# Patient Record
Sex: Female | Born: 1990 | Race: White | Hispanic: No | Marital: Married | Smoking: Never smoker
Health system: Southern US, Community
[De-identification: ages and names within clinical notes are randomized; demographics above are authoritative.]

## PROBLEM LIST (undated history)

## (undated) DIAGNOSIS — E119 Type 2 diabetes mellitus without complications: Secondary | ICD-10-CM

## (undated) DIAGNOSIS — F418 Other specified anxiety disorders: Secondary | ICD-10-CM

## (undated) DIAGNOSIS — G5620 Lesion of ulnar nerve, unspecified upper limb: Secondary | ICD-10-CM

## (undated) DIAGNOSIS — R519 Headache, unspecified: Secondary | ICD-10-CM

## (undated) DIAGNOSIS — E7800 Pure hypercholesterolemia, unspecified: Secondary | ICD-10-CM

## (undated) DIAGNOSIS — G4700 Insomnia, unspecified: Secondary | ICD-10-CM

## (undated) DIAGNOSIS — E782 Mixed hyperlipidemia: Secondary | ICD-10-CM

## (undated) DIAGNOSIS — F32A Depression, unspecified: Secondary | ICD-10-CM

## (undated) DIAGNOSIS — R7303 Prediabetes: Secondary | ICD-10-CM

## (undated) DIAGNOSIS — F419 Anxiety disorder, unspecified: Secondary | ICD-10-CM

## (undated) DIAGNOSIS — R51 Headache: Secondary | ICD-10-CM

## (undated) DIAGNOSIS — F329 Major depressive disorder, single episode, unspecified: Secondary | ICD-10-CM

## (undated) HISTORY — DX: Other specified anxiety disorders: F41.8

## (undated) HISTORY — DX: Type 2 diabetes mellitus without complications: E11.9

## (undated) HISTORY — DX: Mixed hyperlipidemia: E78.2

---
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
## 2012-08-12 IMAGING — CR DG THORACIC SPINE 2V
3 series · 3 of 3 positions shown · non-contrast
Comparison: None

CLINICAL DATA: Injured playing [employer].

THORACIC SPINE - 2 VIEW

[t t-spine a.p. *]
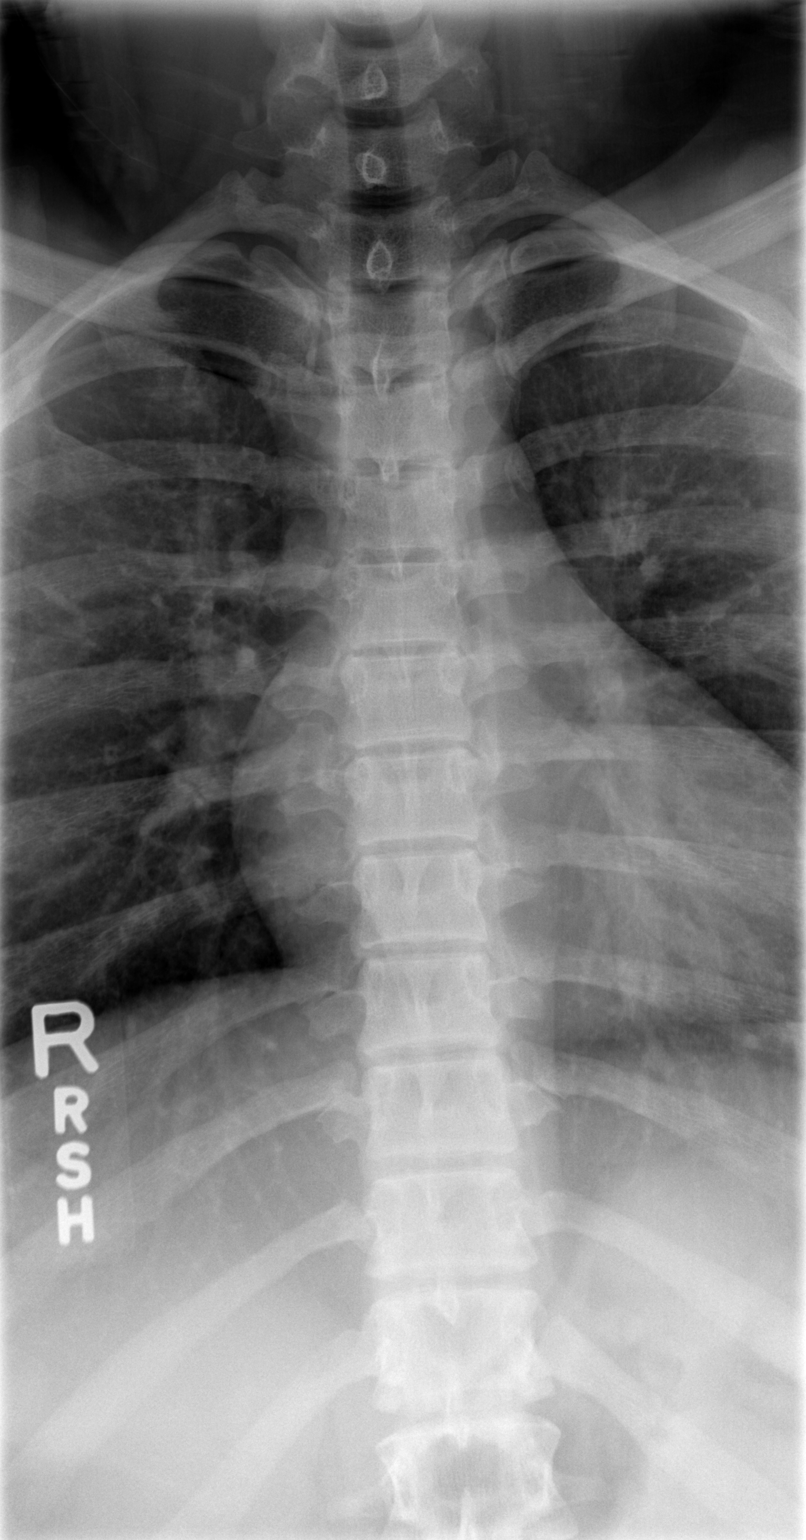

[t t-spine lat]
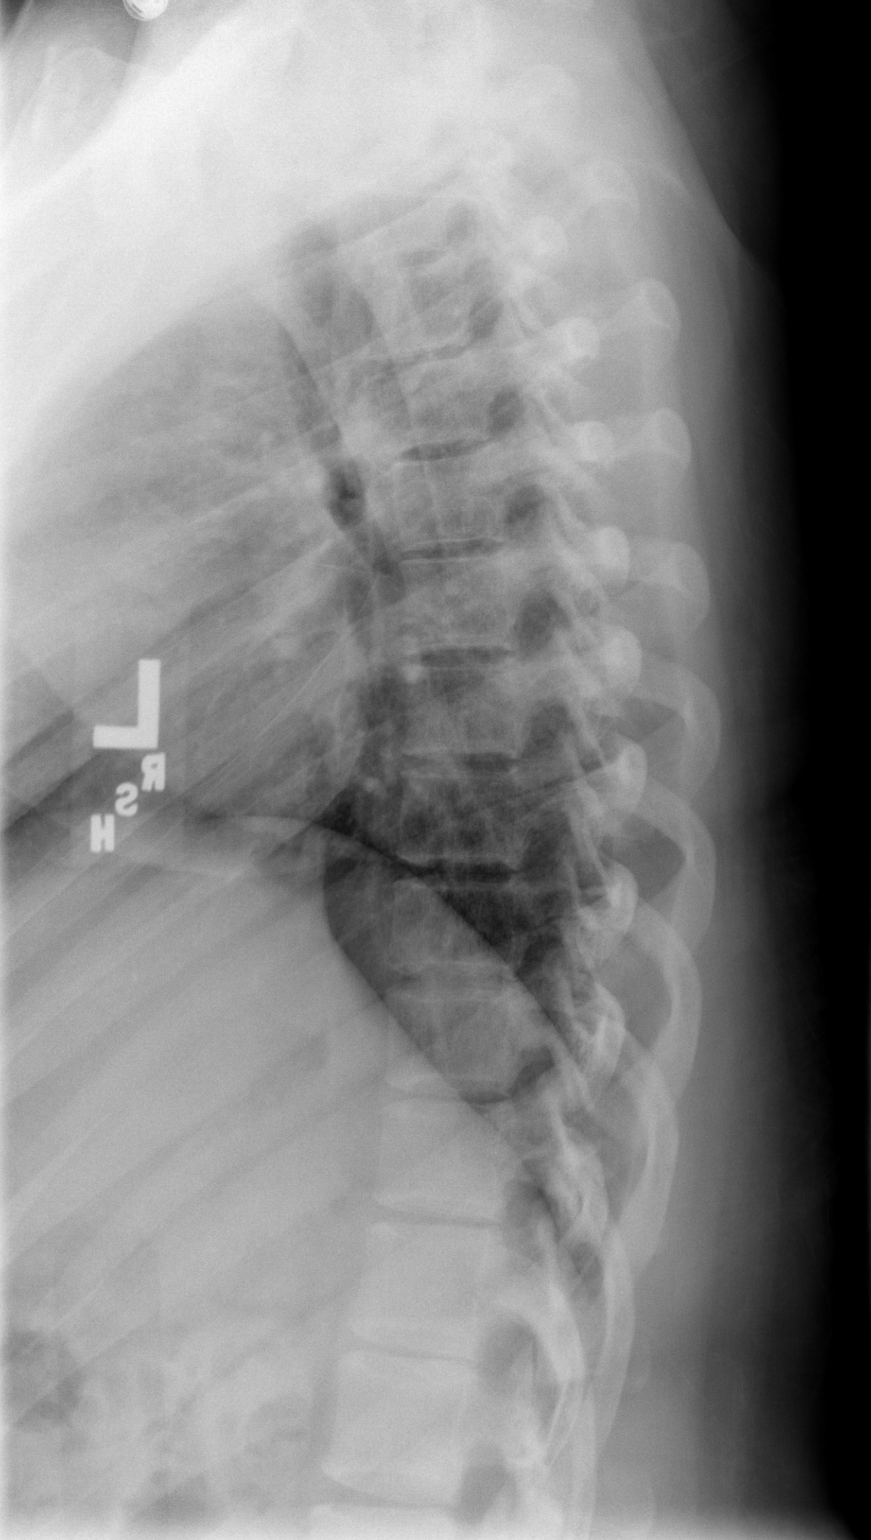

[t swimmers]
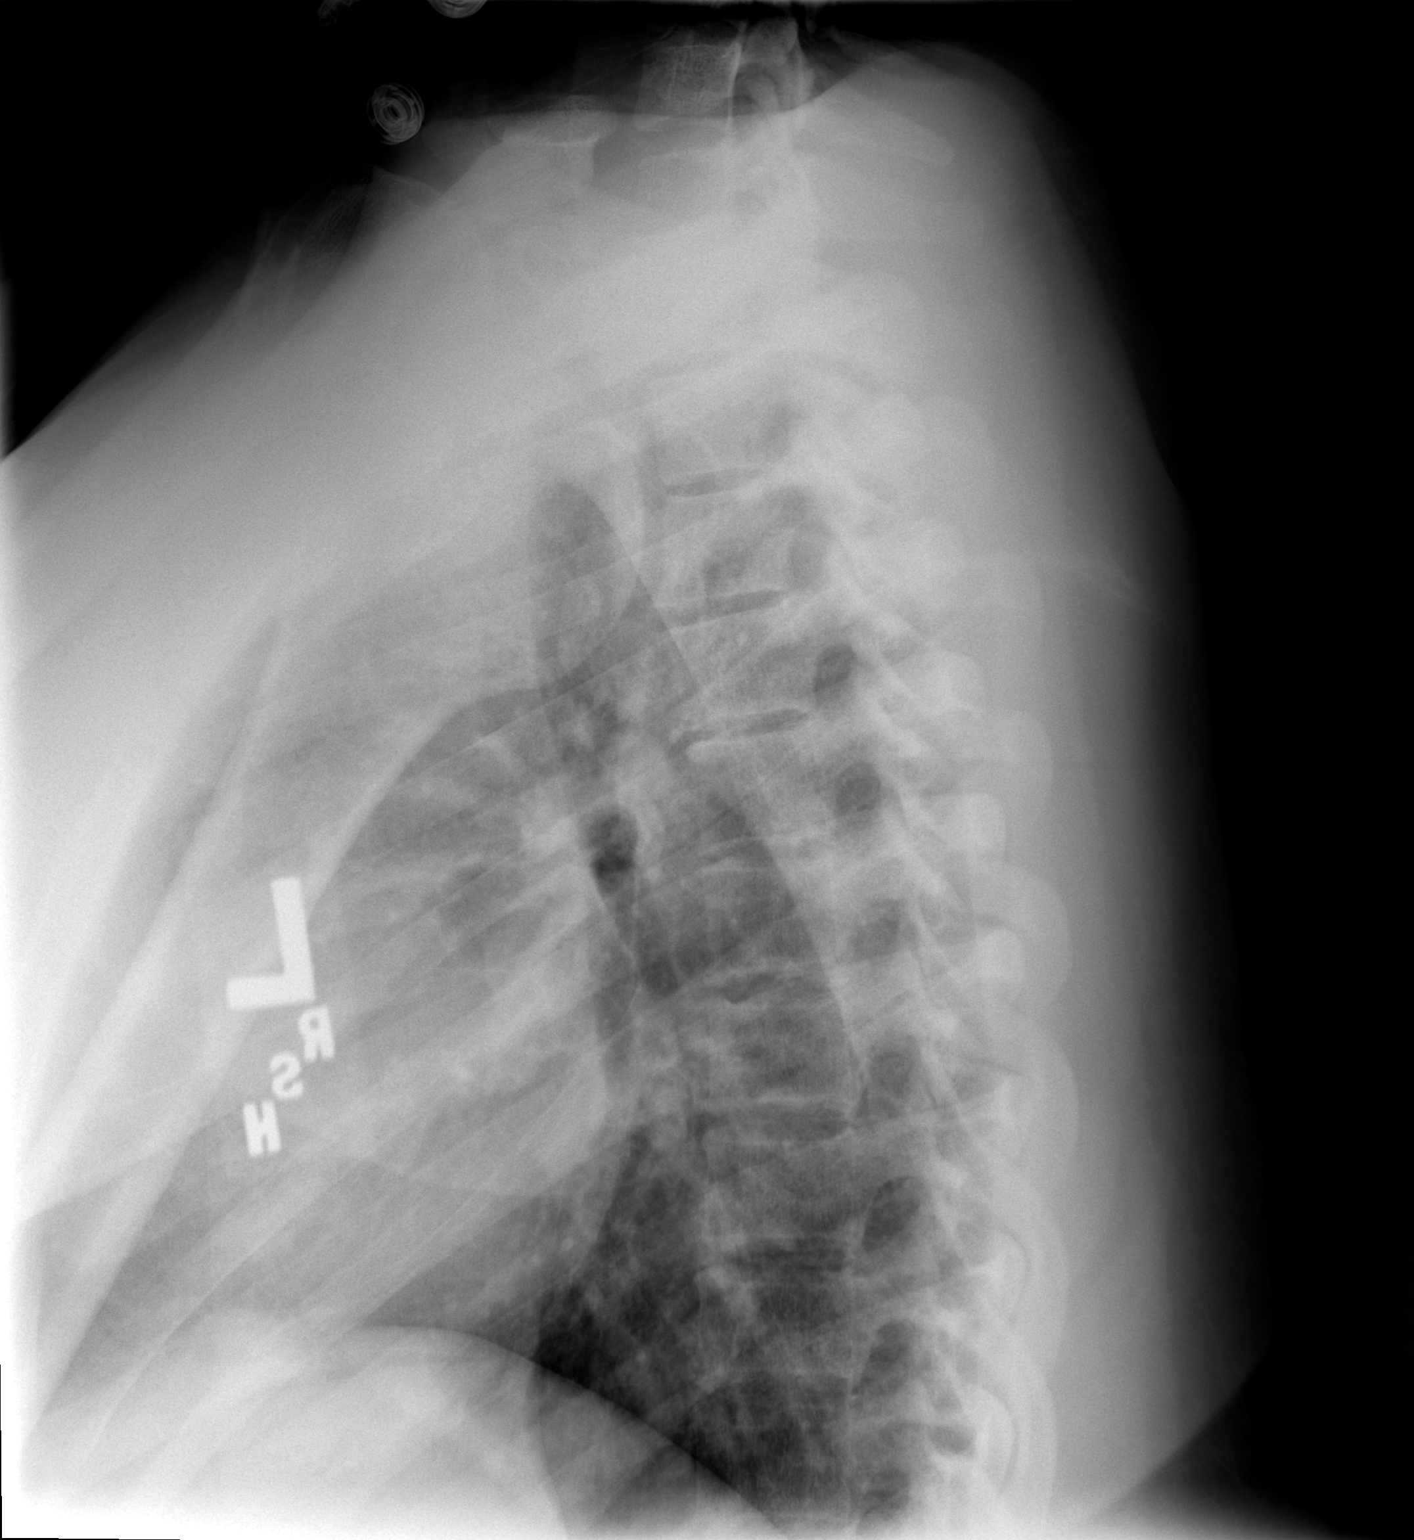

[3 of 3 positions shown; findings below may reference images not displayed]

FINDINGS: The lateral film demonstrates normal alignment of the
thoracic vertebral bodies.  Disc spaces and vertebral bodies are
maintained.  No acute bony findings, destructive bony changes or
abnormal paraspinal soft tissue swelling.  The visualized posterior
ribs appear normal.
IMPRESSION: Normal alignment and no acute bony findings.

## 2012-08-12 IMAGING — CT CT HEAD W/O CM
3 of 6 series · 15 of 47 positions shown, 18 images · non-contrast
Comparison: 04/30/2010
COMPARISON: None.

CLINICAL DATA: Head injury

CT HEAD WITHOUT CONTRAST
TECHNIQUE: Contiguous axial images were obtained from the base of
the skull through the vertex without contrast
CT CERVICAL SPINE WITHOUT CONTRAST
TECHNIQUE: Multidetector CT imaging of the cervical spine was
performed.  Multiplanar CT image reconstructions were also
generated.

[Series 600: sag · sagittal · 0.40mm/px · 3 of 37 slices shown]
[im 13/37  brain]
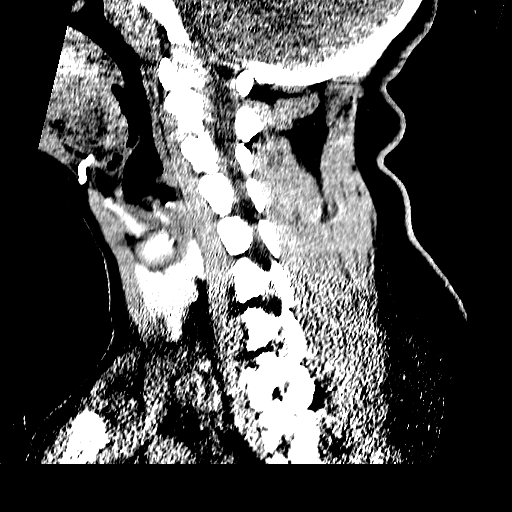
[im 19/37  brain]
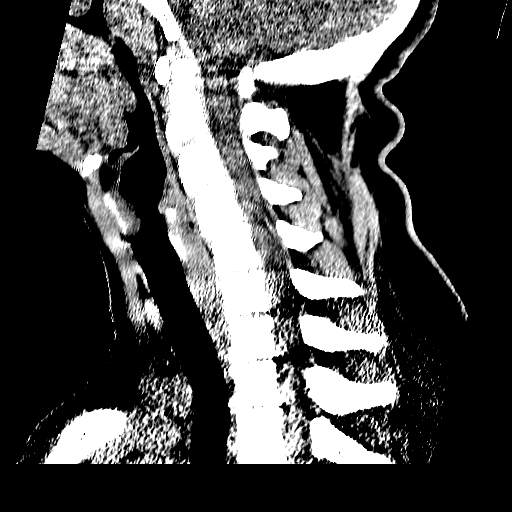
[im 25/37  brain]
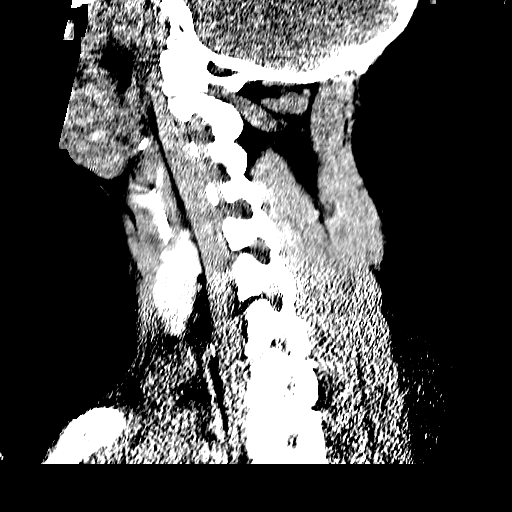

[Series 601: cor · coronal · 0.40mm/px · 3 of 48 slices shown]
[im 16/48  brain]
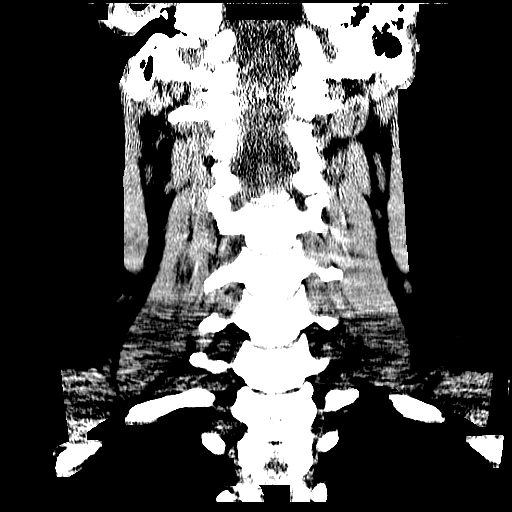
[im 21/48  brain]
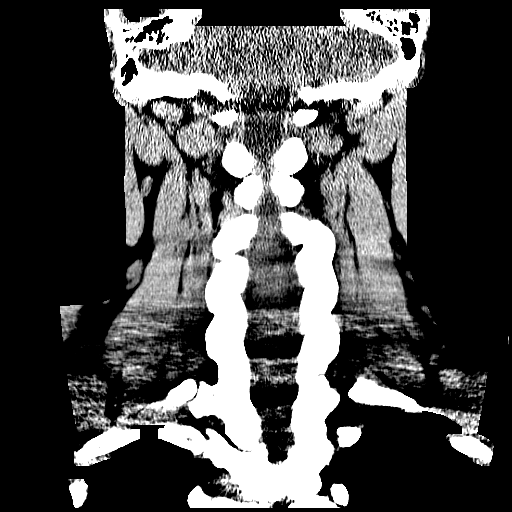
[im 27/48  brain]
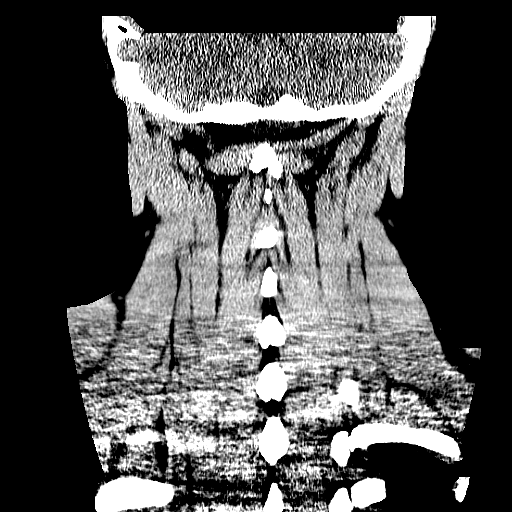

[Series 602: add ang · axial · 0.29mm/px · z∈[-308,-178]mm · 9 of 96 slices shown, 12 images]
[im 10/96  brain]
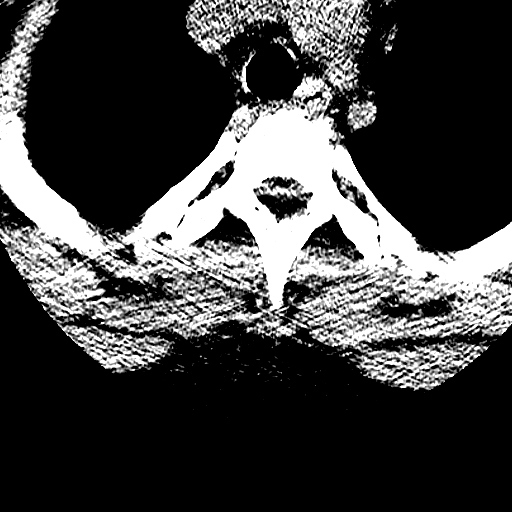
[im 10/96  bone]
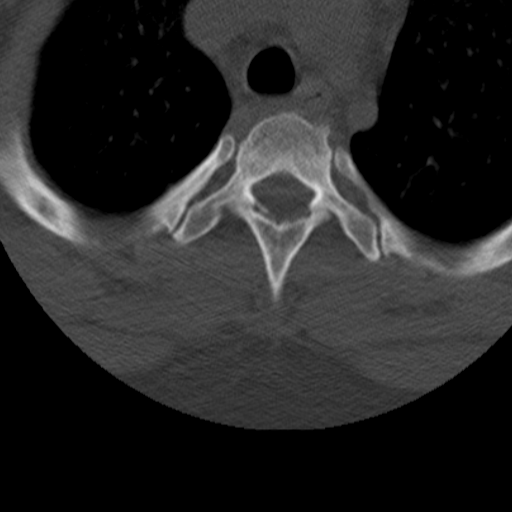
[im 20/96  brain]
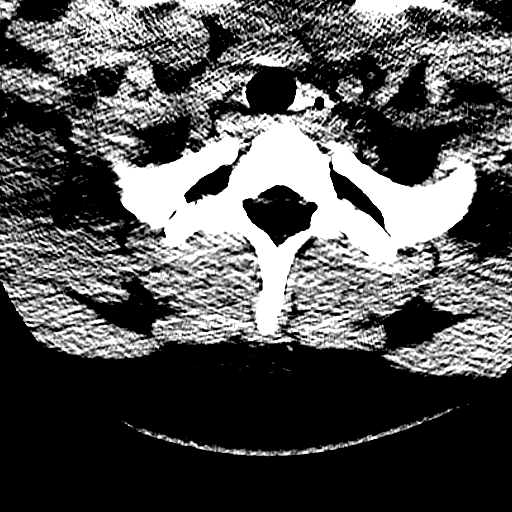
[im 29/96  brain]
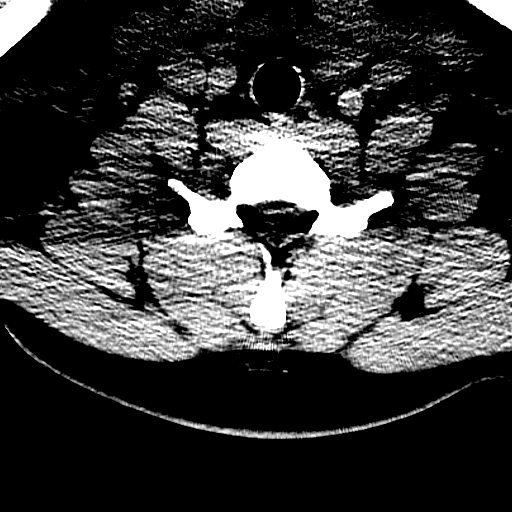
[im 39/96  brain]
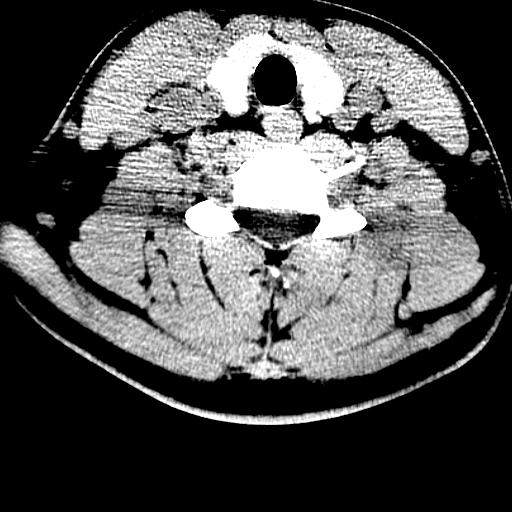
[im 48/96  brain]
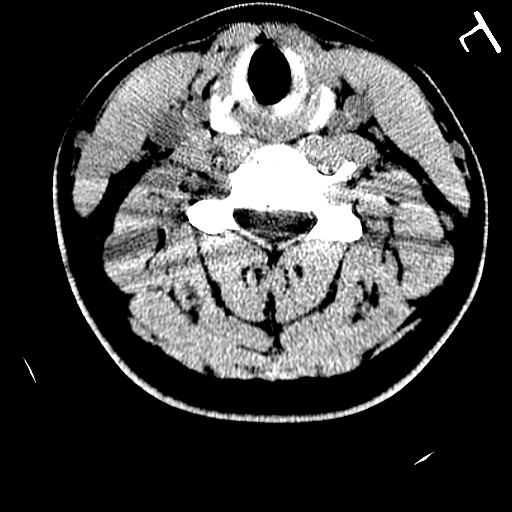
[im 48/96  bone]
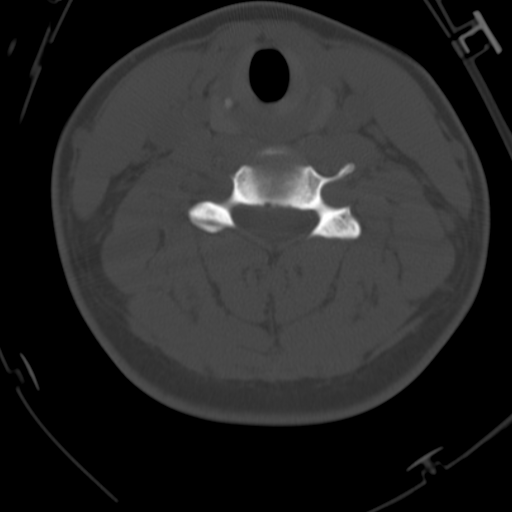
[im 58/96  brain]
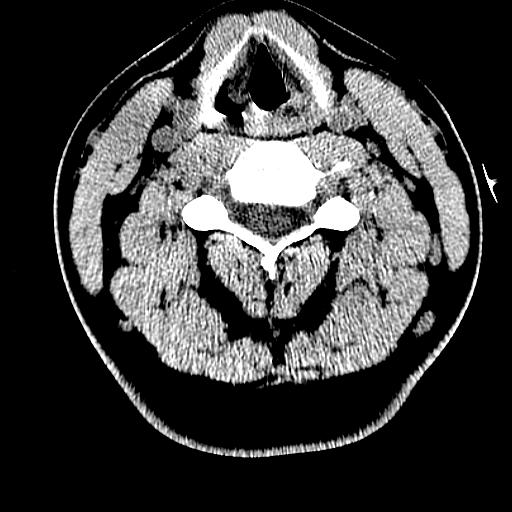
[im 67/96  brain]
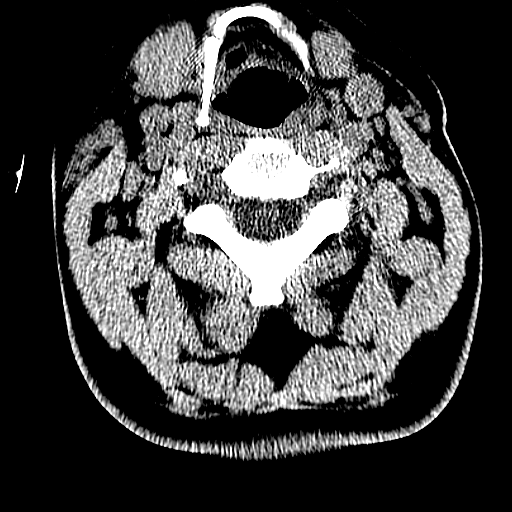
[im 77/96  brain]
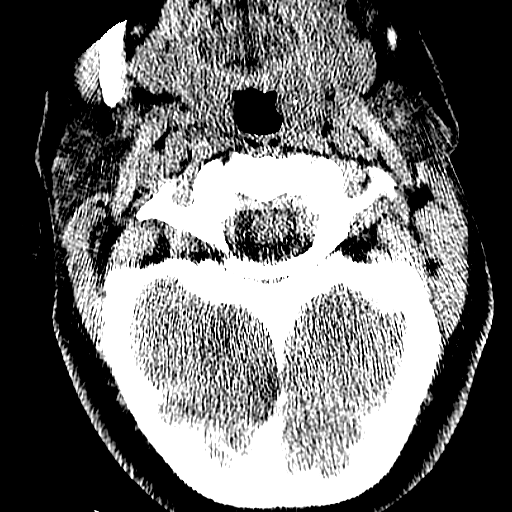
[im 86/96  brain]
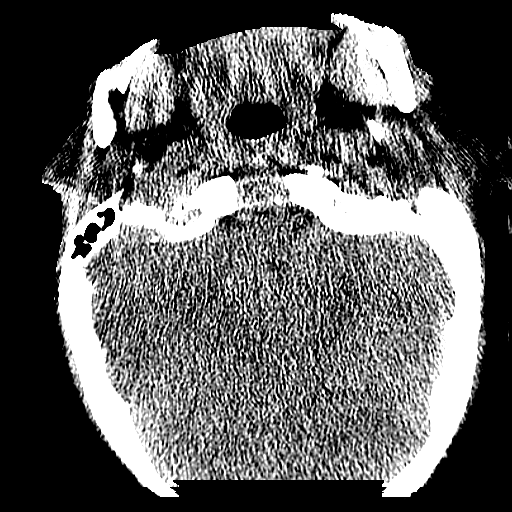
[im 86/96  bone]
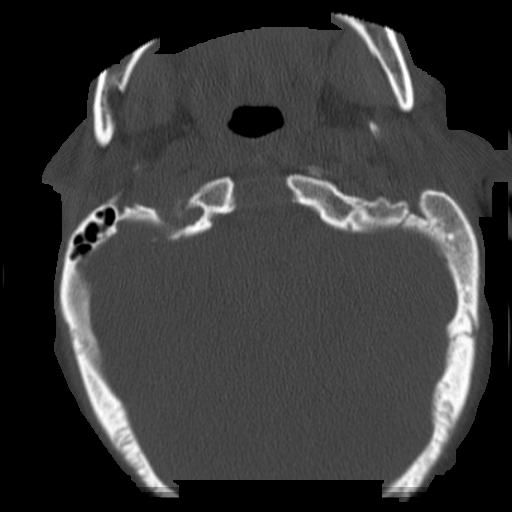

[15 of 47 positions shown; findings below may reference images not displayed]

FINDINGS: The brain has a normal appearance without evidence for
hemorrhage, acute infarction, hydrocephalus, or mass lesion.  There
is no extra axial fluid collection.  The skull and paranasal
sinuses are normal.
IMPRESSION: Normal CT of the head without contrast.
FINDINGS: There is normal alignment of the cervical spine.  Disk
spaces are normal and there is no significant disk degeneration.
No spondylosis is identified and there is no spinal or foraminal
stenosis.  There is no prevertebral soft tissue thickening.

No fracture is identified in the cervical spine.  No mass lesion is
present.
IMPRESSION: Normal CT of the cervical spine without contrast.

## 2015-11-01 HISTORY — PX: CARPAL TUNNEL RELEASE: SHX101

## 2016-05-20 DIAGNOSIS — E782 Mixed hyperlipidemia: Secondary | ICD-10-CM | POA: Diagnosis not present

## 2016-05-20 DIAGNOSIS — R7301 Impaired fasting glucose: Secondary | ICD-10-CM | POA: Diagnosis not present

## 2016-07-23 DIAGNOSIS — E782 Mixed hyperlipidemia: Secondary | ICD-10-CM | POA: Diagnosis not present

## 2016-07-23 DIAGNOSIS — E663 Overweight: Secondary | ICD-10-CM | POA: Diagnosis not present

## 2016-07-23 DIAGNOSIS — Z6841 Body Mass Index (BMI) 40.0 and over, adult: Secondary | ICD-10-CM | POA: Diagnosis not present

## 2016-09-02 HISTORY — PX: CARPAL TUNNEL RELEASE: SHX101

## 2016-09-26 DIAGNOSIS — E782 Mixed hyperlipidemia: Secondary | ICD-10-CM | POA: Diagnosis not present

## 2016-09-26 DIAGNOSIS — R7301 Impaired fasting glucose: Secondary | ICD-10-CM | POA: Diagnosis not present

## 2016-09-26 DIAGNOSIS — E663 Overweight: Secondary | ICD-10-CM | POA: Diagnosis not present

## 2016-09-26 DIAGNOSIS — F321 Major depressive disorder, single episode, moderate: Secondary | ICD-10-CM | POA: Diagnosis not present

## 2016-11-25 DIAGNOSIS — M545 Low back pain: Secondary | ICD-10-CM | POA: Diagnosis not present

## 2016-11-25 DIAGNOSIS — M9903 Segmental and somatic dysfunction of lumbar region: Secondary | ICD-10-CM | POA: Diagnosis not present

## 2016-11-25 DIAGNOSIS — M542 Cervicalgia: Secondary | ICD-10-CM | POA: Diagnosis not present

## 2016-11-25 DIAGNOSIS — M9901 Segmental and somatic dysfunction of cervical region: Secondary | ICD-10-CM | POA: Diagnosis not present

## 2016-11-26 DIAGNOSIS — E663 Overweight: Secondary | ICD-10-CM | POA: Diagnosis not present

## 2016-11-26 DIAGNOSIS — Z6841 Body Mass Index (BMI) 40.0 and over, adult: Secondary | ICD-10-CM | POA: Diagnosis not present

## 2016-11-26 DIAGNOSIS — E782 Mixed hyperlipidemia: Secondary | ICD-10-CM | POA: Diagnosis not present

## 2016-12-13 DIAGNOSIS — M542 Cervicalgia: Secondary | ICD-10-CM | POA: Diagnosis not present

## 2016-12-13 DIAGNOSIS — M9903 Segmental and somatic dysfunction of lumbar region: Secondary | ICD-10-CM | POA: Diagnosis not present

## 2016-12-13 DIAGNOSIS — M9901 Segmental and somatic dysfunction of cervical region: Secondary | ICD-10-CM | POA: Diagnosis not present

## 2016-12-13 DIAGNOSIS — M545 Low back pain: Secondary | ICD-10-CM | POA: Diagnosis not present

## 2017-03-06 DIAGNOSIS — E663 Overweight: Secondary | ICD-10-CM | POA: Diagnosis not present

## 2017-03-06 DIAGNOSIS — R7301 Impaired fasting glucose: Secondary | ICD-10-CM | POA: Diagnosis not present

## 2017-03-06 DIAGNOSIS — E782 Mixed hyperlipidemia: Secondary | ICD-10-CM | POA: Diagnosis not present

## 2017-03-06 DIAGNOSIS — Z6841 Body Mass Index (BMI) 40.0 and over, adult: Secondary | ICD-10-CM | POA: Diagnosis not present

## 2017-04-10 DIAGNOSIS — E782 Mixed hyperlipidemia: Secondary | ICD-10-CM | POA: Diagnosis not present

## 2017-04-10 DIAGNOSIS — E663 Overweight: Secondary | ICD-10-CM | POA: Diagnosis not present

## 2017-04-10 DIAGNOSIS — Z6841 Body Mass Index (BMI) 40.0 and over, adult: Secondary | ICD-10-CM | POA: Diagnosis not present

## 2017-04-10 DIAGNOSIS — F321 Major depressive disorder, single episode, moderate: Secondary | ICD-10-CM | POA: Diagnosis not present

## 2017-08-07 DIAGNOSIS — Z23 Encounter for immunization: Secondary | ICD-10-CM | POA: Diagnosis not present

## 2017-08-07 DIAGNOSIS — E782 Mixed hyperlipidemia: Secondary | ICD-10-CM | POA: Diagnosis not present

## 2017-08-07 DIAGNOSIS — N911 Secondary amenorrhea: Secondary | ICD-10-CM | POA: Diagnosis not present

## 2017-08-07 DIAGNOSIS — Z0289 Encounter for other administrative examinations: Secondary | ICD-10-CM | POA: Diagnosis not present

## 2017-08-07 DIAGNOSIS — R7303 Prediabetes: Secondary | ICD-10-CM | POA: Diagnosis not present

## 2017-08-27 DIAGNOSIS — N925 Other specified irregular menstruation: Secondary | ICD-10-CM | POA: Diagnosis not present

## 2017-09-06 DIAGNOSIS — R102 Pelvic and perineal pain: Secondary | ICD-10-CM | POA: Diagnosis not present

## 2017-09-06 DIAGNOSIS — N926 Irregular menstruation, unspecified: Secondary | ICD-10-CM | POA: Diagnosis not present

## 2017-09-24 DIAGNOSIS — Z3046 Encounter for surveillance of implantable subdermal contraceptive: Secondary | ICD-10-CM | POA: Diagnosis not present

## 2017-10-16 DIAGNOSIS — Z3200 Encounter for pregnancy test, result unknown: Secondary | ICD-10-CM | POA: Diagnosis not present

## 2017-10-16 DIAGNOSIS — Z3202 Encounter for pregnancy test, result negative: Secondary | ICD-10-CM | POA: Diagnosis not present

## 2017-11-04 DIAGNOSIS — Z01419 Encounter for gynecological examination (general) (routine) without abnormal findings: Secondary | ICD-10-CM | POA: Diagnosis not present

## 2017-11-19 DIAGNOSIS — M25522 Pain in left elbow: Secondary | ICD-10-CM | POA: Diagnosis not present

## 2017-12-08 DIAGNOSIS — R200 Anesthesia of skin: Secondary | ICD-10-CM | POA: Diagnosis not present

## 2018-01-21 DIAGNOSIS — G5602 Carpal tunnel syndrome, left upper limb: Secondary | ICD-10-CM | POA: Diagnosis not present

## 2018-02-03 DIAGNOSIS — R200 Anesthesia of skin: Secondary | ICD-10-CM | POA: Diagnosis not present

## 2018-02-09 DIAGNOSIS — F331 Major depressive disorder, recurrent, moderate: Secondary | ICD-10-CM | POA: Diagnosis not present

## 2018-02-09 DIAGNOSIS — R7303 Prediabetes: Secondary | ICD-10-CM | POA: Diagnosis not present

## 2018-02-09 DIAGNOSIS — E782 Mixed hyperlipidemia: Secondary | ICD-10-CM | POA: Diagnosis not present

## 2018-02-09 DIAGNOSIS — R200 Anesthesia of skin: Secondary | ICD-10-CM | POA: Diagnosis not present

## 2018-04-09 DIAGNOSIS — J208 Acute bronchitis due to other specified organisms: Secondary | ICD-10-CM | POA: Diagnosis not present

## 2018-04-23 DIAGNOSIS — G5622 Lesion of ulnar nerve, left upper limb: Secondary | ICD-10-CM | POA: Diagnosis not present

## 2018-05-06 DIAGNOSIS — J060 Acute laryngopharyngitis: Secondary | ICD-10-CM | POA: Diagnosis not present

## 2018-05-13 ENCOUNTER — Other Ambulatory Visit: Payer: Self-pay | Admitting: Orthopedic Surgery

## 2018-05-14 NOTE — Patient Instructions (Addendum)
Ainsley Spinneriffany Odaniel  05/14/2018   Your procedure is scheduled on: 05/22/2018    [facility]    Report to admitting at      5:30 AM    Call this number if you have problems the morning of surgery [phone]       Remember: Do not eat food or drink liquids :After Midnight. BRUSH YOUR TEETH MORNING OF SURGERY AND RINSE YOUR MOUTH OUT, NO CHEWING GUM CANDY OR MINTS.     Take these medicines the morning of surgery with A SIP OF WATER: Lexapro, atorvastatin                                 You may not have any metal on your body including hair pins and              piercings  Do not wear jewelry, make-up, lotions, powders or perfumes, deodorant             Do not wear nail polish.  Do not shave  48 hours prior to surgery.                Do not bring valuables to the hospital. [facility] IS NOT             RESPONSIBLE   FOR VALUABLES.  Contacts, dentures or bridgework may not be worn into surgery.       Patients discharged the day of surgery will not be allowed to drive home.  Name and phone number of your driver:                Please read over the following fact sheets you were given: _____________________________________________________________________             [facility] - Preparing for Surgery Before surgery, you can play an important role.  Because skin is not sterile, your skin needs to be as free of germs as possible.  You can reduce the number of germs on your skin by washing with CHG (chlorahexidine gluconate) soap before surgery.  CHG is an antiseptic cleaner which kills germs and bonds with the skin to continue killing germs even after washing. Please DO NOT use if you have an allergy to CHG or antibacterial soaps.  If your skin becomes reddened/irritated stop using the CHG and inform your nurse when you arrive at Short Stay. Do not shave (including legs and underarms) for at least 48 hours prior to the first CHG  shower.  You may shave your face/neck. Please follow these instructions carefully:  1.  Shower with CHG Soap the night before surgery and the  morning of Surgery.  2.  If you choose to wash your hair, wash your hair first as usual with your  normal  shampoo.  3.  After you shampoo, rinse your hair and body thoroughly to remove the  shampoo.                           4.  Use CHG as you would any other liquid soap.  You can apply chg directly  to the skin and wash                       Gently with a scrungie  or clean washcloth.  5.  Apply the CHG Soap to your body ONLY FROM THE NECK DOWN.   Do not use on face/ open                           Wound or open sores. Avoid contact with eyes, ears mouth and genitals (private parts).                       Wash face,  Genitals (private parts) with your normal soap.             6.  Wash thoroughly, paying special attention to the area where your surgery  will be performed.  7.  Thoroughly rinse your body with warm water from the neck down.  8.  DO NOT shower/wash with your normal soap after using and rinsing off  the CHG Soap.                9.  Pat yourself dry with a clean towel.            10.  Wear clean pajamas.            11.  Place clean sheets on your bed the night of your first shower and do not  sleep with pets. Day of Surgery : Do not apply any lotions/deodorants the morning of surgery.  Please wear clean clothes to the hospital/surgery center.  FAILURE TO FOLLOW THESE INSTRUCTIONS MAY RESULT IN THE CANCELLATION OF YOUR SURGERY PATIENT SIGNATURE_________________________________  NURSE SIGNATURE__________________________________  ________________________________________________________________________

## 2018-05-18 ENCOUNTER — Encounter (HOSPITAL_COMMUNITY): Payer: Self-pay | Admitting: Emergency Medicine

## 2018-05-18 ENCOUNTER — Other Ambulatory Visit: Payer: Self-pay

## 2018-05-18 ENCOUNTER — Encounter (HOSPITAL_COMMUNITY)
Admission: RE | Admit: 2018-05-18 | Discharge: 2018-05-18 | Disposition: A | Discharge status: Home / Self Care | Payer: BLUE CROSS/BLUE SHIELD | Source: Ambulatory Visit | Attending: Orthopedic Surgery | Admitting: Orthopedic Surgery

## 2018-05-18 DIAGNOSIS — Z01818 Encounter for other preprocedural examination: Secondary | ICD-10-CM | POA: Insufficient documentation

## 2018-05-18 DIAGNOSIS — G5622 Lesion of ulnar nerve, left upper limb: Secondary | ICD-10-CM | POA: Diagnosis not present

## 2018-05-18 HISTORY — DX: Lesion of ulnar nerve, unspecified upper limb: G56.20

## 2018-05-18 HISTORY — DX: Prediabetes: R73.03

## 2018-05-18 HISTORY — DX: Depression, unspecified: F32.A

## 2018-05-18 HISTORY — DX: Pure hypercholesterolemia, unspecified: E78.00

## 2018-05-18 HISTORY — DX: Major depressive disorder, single episode, unspecified: F32.9

## 2018-05-18 HISTORY — DX: Anxiety disorder, unspecified: F41.9

## 2018-05-18 HISTORY — DX: Headache, unspecified: R51.9

## 2018-05-18 HISTORY — DX: Insomnia, unspecified: G47.00

## 2018-05-18 HISTORY — DX: Headache: R51

## 2018-05-18 LAB — HCG, SERUM, QUALITATIVE: PREG SERUM: NEGATIVE

## 2018-05-18 LAB — CBC
HCT: 36.6 % (ref 36.0–46.0)
HEMOGLOBIN: 11.9 g/dL — AB (ref 12.0–15.0)
MCH: 28.1 pg (ref 26.0–34.0)
MCHC: 32.5 g/dL (ref 30.0–36.0)
MCV: 86.5 fL (ref 78.0–100.0)
Platelets: 279 10*3/uL (ref 150–400)
RBC: 4.23 MIL/uL (ref 3.87–5.11)
RDW: 13.9 % (ref 11.5–15.5)
WBC: 8.8 10*3/uL (ref 4.0–10.5)

## 2018-05-18 NOTE — Progress Notes (Signed)
Patient called back to ask if she could take her birth control am of surgery. RN informed she is ok to take am of surgery

## 2018-05-21 MED ORDER — DEXTROSE 5 % IV SOLN
3.0000 g | INTRAVENOUS | Status: AC
  Administered 2018-05-22: 3 g via INTRAVENOUS
  Filled 2018-05-21: qty 3

## 2018-05-21 NOTE — H&P (Signed)
Shelby Johnson is an 27 y.o. female.   CC / Reason for Visit: Left elbow pain and numbness and tingling HPI: This patient returns for reevaluation, having undergone left cubital tunnel injection by Dr. Althea Charon on 02-09-18.  She reports that during the lidocaine phase, her pain was completely alleviated.  In addition for the first couple of weeks following the injection her numbness and tingling improved greatly as did her elbow pain.  She confirms that if surgery could offer the type of relief that she experienced during the first couple weeks, it would be very worthwhile  HPI 02-03-18: She returns to clinic today for reevaluation after her nerve conduction studies which were completed on 01/21/2018.  She reminds me that she has had previous carpal tunnel surgery bilaterally in 2017 by Dr. Bradly Bienenstock.  She indicates that the steroid Dosepak was not all that helpful, but she continues to take the gabapentin one time per day at nighttime.    HPI 12/08/2017: This patient is a 27 year old, right-hand-dominant, female who is known to [place] from previous evaluation on 09/25/2015.  At that time she was diagnosed with left carpal tunnel syndrome with NCS/EMG performed on 08/24/2015 By Dr. Anne Hahn of [facility].  She was originally scheduled for left endoscopic carpal tunnel release which was approved, but was then canceled by the patient secondary due to insufficient funds.  She indicates that she did have carpal tunnel surgery bilaterally, and reports that it was with Dr. Janee Morn.  She further reports on her medical history that her left hand carpal tunnel surgery was in March 2017, and right hand was May 2017.  There are no records in our system supporting this.  She is here today for numbness and tingling into her small and ring fingers which has been present for over 6 weeks and is reportedly also extending into her elbow as well as her shoulder and neck.  The patient reports that she has had neck pain for  over 2 years for which she sees a [facility].  She also has difficulty with rotation of her neck to the left.  She is here today at the request of Gus Height, PA-C for further evaluation and treatment.  Past Medical History:  Diagnosis Date  . Anxiety   . Cubital tunnel syndrome   . Depression   . Headache    and migraines   . Hypercholesterolemia   . Insomnia   . Pre-diabetes     Past Surgical History:  Procedure Laterality Date  . CARPAL TUNNEL RELEASE Left 11/2015  . CARPAL TUNNEL RELEASE Right 2018    No family history on file. Social History:  reports that she has never smoked. She has never used smokeless tobacco. She reports that she drank alcohol. She reports that she does not use drugs.  Allergies:  Allergies  Allergen Reactions  . Imitrex [Sumatriptan] Other (See Comments)    Tenses body up    No medications prior to admission.    No results found for this or any previous visit (from the past 48 hour(s)). No results found.  Review of Systems  All other systems reviewed and are negative.   Last menstrual period 05/09/2018. Physical Exam  Constitutional:  WD, WN, NAD HEENT:  NCAT, EOMI Neuro/Psych:  Alert & oriented to person, place, and time; appropriate mood & affect Lymphatic: No generalized UE edema or lymphadenopathy Extremities / MSK:  Both UE are normal with respect to appearance, ranges of motion, joint stability, muscle strength/tone, sensation, &  perfusion except as otherwise noted:  The left hand is normal in appearance.  There is still altered sensibility into the small finger and the ring finger of the left hand with light touch.  Positive elbow flexion test on the left.  Monofilament 2.83 on the left except ulnar ring finger 3.61 and small finger 4.31.  Labs / X-rays:  No radiographic studies obtained today.  Assessment: Left hand numbness and tingling in ulnar nerve distribution--likely cubital tunnel syndrome  Plan:  Findings are  discussed with the patient.  We discussed options for proceeding.  She would like to proceed with surgical decompression.  We will attempt decompression in situ, but discussed the circumstances under which subcutaneous transposition is required based upon intraoperative assessment.  The details of the operative procedure were discussed with the patient.  Questions were invited and answered.  In addition to the goal of the procedure, the risks of the procedure to include but not limited to bleeding; infection; damage to the nerves or blood vessels that could result in bleeding, numbness, weakness, chronic pain, and the need for additional procedures; stiffness; the need for revision surgery; and anesthetic risks were reviewed.  No specific outcome was guaranteed or implied.  Informed consent was obtained.  Jodi Marbleavid A Corianna Avallone, MD 05/21/2018, 2:46 PM

## 2018-05-21 NOTE — Anesthesia Preprocedure Evaluation (Addendum)
Anesthesia Evaluation  Patient identified by MRN, date of birth, ID band Patient awake    Reviewed: Allergy & Precautions, H&P , NPO status , Patient's Chart, lab work & pertinent test results, reviewed documented beta blocker date and time   Airway Mallampati: II  TM Distance: >3 FB Neck ROM: full    Dental no notable dental hx.    Pulmonary neg pulmonary ROS,    Pulmonary exam normal breath sounds clear to auscultation       Cardiovascular Exercise Tolerance: Good negative cardio ROS   Rhythm:regular Rate:Normal     Neuro/Psych  Headaches, PSYCHIATRIC DISORDERS Anxiety Depression  Neuromuscular disease    GI/Hepatic negative GI ROS, Neg liver ROS,   Endo/Other  Morbid obesity  Renal/GU negative Renal ROS  negative genitourinary   Musculoskeletal negative musculoskeletal ROS (+)   Abdominal   Peds  Hematology negative hematology ROS (+)   Anesthesia Other Findings   Reproductive/Obstetrics negative OB ROS                            Anesthesia Physical Anesthesia Plan  ASA: II  Anesthesia Plan: General   Post-op Pain Management:    Induction:   PONV Risk Score and Plan: 3 and Treatment may vary due to age or medical condition, Dexamethasone and Ondansetron  Airway Management Planned: LMA  Additional Equipment:   Intra-op Plan:   Post-operative Plan: Extubation in OR  Informed Consent: I have reviewed the patients History and Physical, chart, labs and discussed the procedure including the risks, benefits and alternatives for the proposed anesthesia with the patient or authorized representative who has indicated his/her understanding and acceptance.   Dental Advisory Given  Plan Discussed with: CRNA, Anesthesiologist and Surgeon  Anesthesia Plan Comments:         Anesthesia Quick Evaluation

## 2018-05-22 ENCOUNTER — Ambulatory Visit (HOSPITAL_COMMUNITY): Payer: BLUE CROSS/BLUE SHIELD | Admitting: Anesthesiology

## 2018-05-22 ENCOUNTER — Ambulatory Visit (HOSPITAL_COMMUNITY)
Admission: RE | Admit: 2018-05-22 | Discharge: 2018-05-22 | Disposition: A | Discharge status: Home / Self Care | Payer: BLUE CROSS/BLUE SHIELD | Source: Ambulatory Visit | Attending: Orthopedic Surgery | Admitting: Orthopedic Surgery

## 2018-05-22 ENCOUNTER — Encounter (HOSPITAL_COMMUNITY): Payer: Self-pay

## 2018-05-22 ENCOUNTER — Encounter (HOSPITAL_COMMUNITY)
Admission: RE | Disposition: A | Discharge status: Home / Self Care | Payer: Self-pay | Source: Ambulatory Visit | Attending: Orthopedic Surgery

## 2018-05-22 DIAGNOSIS — G43909 Migraine, unspecified, not intractable, without status migrainosus: Secondary | ICD-10-CM | POA: Diagnosis not present

## 2018-05-22 DIAGNOSIS — Z6841 Body Mass Index (BMI) 40.0 and over, adult: Secondary | ICD-10-CM | POA: Diagnosis not present

## 2018-05-22 DIAGNOSIS — E7800 Pure hypercholesterolemia, unspecified: Secondary | ICD-10-CM | POA: Insufficient documentation

## 2018-05-22 DIAGNOSIS — R7303 Prediabetes: Secondary | ICD-10-CM | POA: Diagnosis not present

## 2018-05-22 DIAGNOSIS — G5622 Lesion of ulnar nerve, left upper limb: Secondary | ICD-10-CM | POA: Diagnosis not present

## 2018-05-22 DIAGNOSIS — Z888 Allergy status to other drugs, medicaments and biological substances status: Secondary | ICD-10-CM | POA: Insufficient documentation

## 2018-05-22 DIAGNOSIS — G4700 Insomnia, unspecified: Secondary | ICD-10-CM | POA: Diagnosis not present

## 2018-05-22 DIAGNOSIS — F329 Major depressive disorder, single episode, unspecified: Secondary | ICD-10-CM | POA: Insufficient documentation

## 2018-05-22 DIAGNOSIS — F419 Anxiety disorder, unspecified: Secondary | ICD-10-CM | POA: Diagnosis not present

## 2018-05-22 HISTORY — PX: ANTERIOR INTEROSSEOUS NERVE DECOMPRESSION: SHX5735

## 2018-05-22 SURGERY — ANTERIOR INTEROSSEOUS NERVE DECOMPRESSION
Anesthesia: General | Site: Elbow | Laterality: Left

## 2018-05-22 MED ORDER — ACETAMINOPHEN 160 MG/5ML PO SOLN: 325.0000 mg | ORAL | Status: DC | PRN

## 2018-05-22 MED ORDER — 0.9 % SODIUM CHLORIDE (POUR BTL) OPTIME
TOPICAL | Status: DC | PRN
  Administered 2018-05-22: 1000 mL

## 2018-05-22 MED ORDER — LIDOCAINE 2% (20 MG/ML) 5 ML SYRINGE
INTRAMUSCULAR | Status: DC | PRN
  Administered 2018-05-22: 80 mg via INTRAVENOUS

## 2018-05-22 MED ORDER — MEPERIDINE HCL 50 MG/ML IJ SOLN: 6.2500 mg | INTRAMUSCULAR | Status: DC | PRN

## 2018-05-22 MED ORDER — PROPOFOL 10 MG/ML IV BOLUS
INTRAVENOUS | Status: DC | PRN
  Administered 2018-05-22: 200 mg via INTRAVENOUS

## 2018-05-22 MED ORDER — ONDANSETRON HCL 4 MG/2ML IJ SOLN: 4.0000 mg | Freq: Once | INTRAMUSCULAR | Status: DC | PRN

## 2018-05-22 MED ORDER — ONDANSETRON HCL 4 MG/2ML IJ SOLN
INTRAMUSCULAR | Status: DC | PRN
  Administered 2018-05-22: 4 mg via INTRAVENOUS

## 2018-05-22 MED ORDER — ACETAMINOPHEN 325 MG PO TABS: 650.0000 mg | ORAL_TABLET | Freq: Four times a day (QID) | ORAL | Status: DC

## 2018-05-22 MED ORDER — BUPIVACAINE-EPINEPHRINE (PF) 0.5% -1:200000 IJ SOLN
INTRAMUSCULAR | Status: AC
  Filled 2018-05-22: qty 30

## 2018-05-22 MED ORDER — ACETAMINOPHEN 325 MG PO TABS: 325.0000 mg | ORAL_TABLET | ORAL | Status: DC | PRN

## 2018-05-22 MED ORDER — OXYCODONE HCL 5 MG PO TABS: 5.0000 mg | ORAL_TABLET | Freq: Four times a day (QID) | ORAL | 0 refills | Status: DC | PRN

## 2018-05-22 MED ORDER — OXYCODONE HCL 5 MG/5ML PO SOLN
5.0000 mg | Freq: Once | ORAL | Status: DC | PRN
  Filled 2018-05-22: qty 5

## 2018-05-22 MED ORDER — MIDAZOLAM HCL 2 MG/2ML IJ SOLN
INTRAMUSCULAR | Status: DC | PRN
  Administered 2018-05-22: 2 mg via INTRAVENOUS

## 2018-05-22 MED ORDER — DEXAMETHASONE SODIUM PHOSPHATE 4 MG/ML IJ SOLN
INTRAMUSCULAR | Status: DC | PRN
  Administered 2018-05-22: 10 mg via INTRAVENOUS

## 2018-05-22 MED ORDER — LACTATED RINGERS IV SOLN
INTRAVENOUS | Status: DC
  Administered 2018-05-22: 07:00:00 via INTRAVENOUS

## 2018-05-22 MED ORDER — FENTANYL CITRATE (PF) 100 MCG/2ML IJ SOLN
INTRAMUSCULAR | Status: AC
  Filled 2018-05-22: qty 2

## 2018-05-22 MED ORDER — KETOROLAC TROMETHAMINE 30 MG/ML IJ SOLN
INTRAMUSCULAR | Status: DC | PRN
  Administered 2018-05-22: 30 mg via INTRAVENOUS

## 2018-05-22 MED ORDER — MIDAZOLAM HCL 2 MG/2ML IJ SOLN
0.5000 mg | INTRAMUSCULAR | Status: AC | PRN
  Administered 2018-05-22: 0.5 mg via INTRAVENOUS

## 2018-05-22 MED ORDER — FENTANYL CITRATE (PF) 100 MCG/2ML IJ SOLN
25.0000 ug | INTRAMUSCULAR | Status: DC | PRN
  Administered 2018-05-22: 50 ug via INTRAVENOUS
  Administered 2018-05-22 (×2): 25 ug via INTRAVENOUS

## 2018-05-22 MED ORDER — OXYCODONE HCL 5 MG PO TABS: 5.0000 mg | ORAL_TABLET | Freq: Once | ORAL | Status: DC | PRN

## 2018-05-22 MED ORDER — LIDOCAINE HCL 2 % IJ SOLN
INTRAMUSCULAR | Status: AC
  Filled 2018-05-22: qty 20

## 2018-05-22 MED ORDER — MIDAZOLAM HCL 2 MG/2ML IJ SOLN
INTRAMUSCULAR | Status: AC
  Filled 2018-05-22: qty 2

## 2018-05-22 MED ORDER — FENTANYL CITRATE (PF) 100 MCG/2ML IJ SOLN
INTRAMUSCULAR | Status: DC | PRN
  Administered 2018-05-22 (×8): 50 ug via INTRAVENOUS

## 2018-05-22 MED ORDER — IBUPROFEN 200 MG PO TABS: 600.0000 mg | ORAL_TABLET | Freq: Four times a day (QID) | ORAL | Status: AC

## 2018-05-22 MED ORDER — BUPIVACAINE-EPINEPHRINE (PF) 0.25% -1:200000 IJ SOLN
INTRAMUSCULAR | Status: AC
  Filled 2018-05-22: qty 30

## 2018-05-22 SURGICAL SUPPLY — 52 items
BENZOIN TINCTURE PRP APPL 2/3 (GAUZE/BANDAGES/DRESSINGS) ×2 IMPLANT
BLADE MINI RND TIP GREEN BEAV (BLADE) IMPLANT
BLADE SURG 15 STRL LF DISP TIS (BLADE) IMPLANT
BLADE SURG 15 STRL SS (BLADE)
BNDG COHESIVE 4X5 TAN STRL (GAUZE/BANDAGES/DRESSINGS) ×2 IMPLANT
BNDG ESMARK 4X9 LF (GAUZE/BANDAGES/DRESSINGS) ×2 IMPLANT
BNDG GAUZE ELAST 4 BULKY (GAUZE/BANDAGES/DRESSINGS) ×2 IMPLANT
BNDG PLASTER X FAST 3X3 WHT LF (CAST SUPPLIES) IMPLANT
CHLORAPREP W/TINT 26ML (MISCELLANEOUS) ×2 IMPLANT
CORD BIPOLAR FORCEPS 12FT (ELECTRODE) ×2 IMPLANT
COVER BACK TABLE 60X90IN (DRAPES) IMPLANT
COVER MAYO STAND STRL (DRAPES) ×2 IMPLANT
CUFF TOURN SGL QUICK 18 (TOURNIQUET CUFF) IMPLANT
DECANTER SPIKE VIAL GLASS SM (MISCELLANEOUS) ×2 IMPLANT
DRAIN PENROSE 18X1/2 LTX STRL (DRAIN) IMPLANT
DRAPE EXTREMITY T 121X128X90 (DRAPE) ×2 IMPLANT
DRAPE SURG 17X11 SM STRL (DRAPES) IMPLANT
DRAPE U-SHAPE 47X51 STRL (DRAPES) ×2 IMPLANT
DRSG EMULSION OIL 3X3 NADH (GAUZE/BANDAGES/DRESSINGS) ×2 IMPLANT
GAUZE SPONGE 4X4 12PLY STRL (GAUZE/BANDAGES/DRESSINGS) IMPLANT
GLOVE BIO SURGEON STRL SZ7.5 (GLOVE) ×2 IMPLANT
GLOVE BIOGEL PI IND STRL 7.0 (GLOVE) ×1 IMPLANT
GLOVE BIOGEL PI IND STRL 8 (GLOVE) ×1 IMPLANT
GLOVE BIOGEL PI INDICATOR 7.0 (GLOVE) ×1
GLOVE BIOGEL PI INDICATOR 8 (GLOVE) ×1
GLOVE ECLIPSE 6.5 STRL STRAW (GLOVE) ×2 IMPLANT
GOWN STRL REUS W/ TWL LRG LVL3 (GOWN DISPOSABLE) ×2 IMPLANT
GOWN STRL REUS W/TWL LRG LVL3 (GOWN DISPOSABLE) ×2
GOWN STRL REUS W/TWL XL LVL3 (GOWN DISPOSABLE) ×2 IMPLANT
KIT BASIN OR (CUSTOM PROCEDURE TRAY) ×2 IMPLANT
NEEDLE HYPO 25X1 1.5 SAFETY (NEEDLE) ×2 IMPLANT
NS IRRIG 1000ML POUR BTL (IV SOLUTION) IMPLANT
PACK ORTHO EXTREMITY (CUSTOM PROCEDURE TRAY) ×2 IMPLANT
PAD CAST 4YDX4 CTTN HI CHSV (CAST SUPPLIES) ×2 IMPLANT
PADDING CAST ABS 4INX4YD NS (CAST SUPPLIES)
PADDING CAST ABS COTTON 4X4 ST (CAST SUPPLIES) IMPLANT
PADDING CAST COTTON 4X4 STRL (CAST SUPPLIES) ×2
PADDING CAST COTTON 6X4 STRL (CAST SUPPLIES) ×2 IMPLANT
PENCIL BUTTON HOLSTER BLD 10FT (ELECTRODE) IMPLANT
SLING ARM FOAM STRAP XLG (SOFTGOODS) ×2 IMPLANT
STOCKINETTE 6  STRL (DRAPES) ×1
STOCKINETTE 6 STRL (DRAPES) ×1 IMPLANT
STRIP CLOSURE SKIN 1/2X4 (GAUZE/BANDAGES/DRESSINGS) ×2 IMPLANT
SUT VIC AB 2-0 SH 27 (SUTURE) ×1
SUT VIC AB 2-0 SH 27XBRD (SUTURE) ×1 IMPLANT
SUT VICRYL RAPIDE 4/0 PS 2 (SUTURE) ×2 IMPLANT
SYR 10ML LL (SYRINGE) ×2 IMPLANT
SYR BULB 3OZ (MISCELLANEOUS) ×2 IMPLANT
TOWEL OR 17X26 10 PK STRL BLUE (TOWEL DISPOSABLE) ×2 IMPLANT
TOWEL OR NON WOVEN STRL DISP B (DISPOSABLE) ×2 IMPLANT
UNDERPAD 30X30 (UNDERPADS AND DIAPERS) ×2 IMPLANT
YANKAUER SUCT BULB TIP 10FT TU (MISCELLANEOUS) ×2 IMPLANT

## 2018-05-22 NOTE — Op Note (Signed)
05/22/2018  7:24 AM  PATIENT:  Shelby Johnson  27 y.o. female  PRE-OPERATIVE DIAGNOSIS:  Left cubital tunnel syndrome  POST-OPERATIVE DIAGNOSIS:  Same  PROCEDURE:  Left ulnar neuroplasty at the elbow (DIS)  SURGEON: Cliffton Astersavid A. Janee Mornhompson, MD  PHYSICIAN ASSISTANT: Danielle RankinKirsten Schrader, OPA-C  ANESTHESIA:  general  SPECIMENS:  None  DRAINS:   None  EBL:  less than 50 mL  PREOPERATIVE INDICATIONS:  Shelby Spinneriffany Locastro is a  27 y.o. female with left cubital tunnel syndrome  The risks benefits and alternatives were discussed with the patient preoperatively including but not limited to the risks of infection, bleeding, nerve injury, cardiopulmonary complications, the need for revision surgery, among others, and the patient verbalized understanding and consented to proceed.  OPERATIVE IMPLANTS: none  OPERATIVE PROCEDURE:  After receiving prophylactic antibiotics, the patient was escorted to the operative theatre and placed in a supine position. General anesthesia was administered.  A surgical "time-out" was performed during which the planned procedure, proposed operative site, and the correct patient identity were compared to the operative consent and agreement confirmed by the circulating nurse according to current facility policy.  Following application of a tourniquet to the operative extremity, the exposed skin was prepped with Chloraprep and draped in the usual sterile fashion.  The limb was exsanguinated with an Esmarch bandage and the tourniquet inflated to approximately 100mmHg higher than systolic BP.  A curvilinear incision was marked along the course of the ulnar nerve at the elbow, using the medial epicondyle and prominence of the olecranon as anatomical landmarks.  The skin was incised sharply with a scalpel and Bovie electrocautery.  Self retainer was placed.  The medial epicondyle and olecranon were again palpated deep in the wound to understand likely course of the nerve, and then  the nerve was dissected free through the cubital tunnel proper.  There was a slip of muscle representing a small anconeus epitrochlear is crossing it as well.  It was divided.  The layers over the nerve were then meticulously divided proximally for about 10 cm.  The nerve was free from compression as it left the posterior and headed into the anterior compartment.  Distally then the superficial FCU fascia was split, followed by the deep fascia.  It was in this region of the nerve was much more irritable, causing some twitching distally.  Once the nerve was completely freed from its overlying compressing structures throughout the whole zone, the elbow was cycled to flexion extension and the nerve rested in the groove without subluxation.  The wound was copiously irrigated and the tourniquet released.  The skin was closed with 2-0 Vicryl deep dermal buried subcuticular sutures.  Half percent Marcaine with epinephrine was instilled into the skin edges to help with postoperative pain control, an the skin was closed with 4-0 Vicryl Rapide subcuticular sutures, benzoin, Steri-Strips.  A bulky long-arm dressing was applied and her arm was placed into a sling.  She was awakened and taken to the recovery in stable condition, breathing spontaneously.  DISPOSITION: She will be discharged home today with typical instructions, returning 10 to 15 days.

## 2018-05-22 NOTE — Anesthesia Procedure Notes (Signed)
Procedure Name: LMA Insertion Date/Time: 05/22/2018 7:34 AM Performed by: Vanessa Durhamochran, Oaklynn Stierwalt Glenn, CRNA Pre-anesthesia Checklist: Emergency Drugs available, Patient identified, Suction available and Patient being monitored Patient Re-evaluated:Patient Re-evaluated prior to induction Oxygen Delivery Method: Circle system utilized Preoxygenation: Pre-oxygenation with 100% oxygen Induction Type: IV induction Ventilation: Mask ventilation without difficulty LMA: LMA inserted LMA Size: 4.0 Number of attempts: 1 Placement Confirmation: positive ETCO2 and breath sounds checked- equal and bilateral Tube secured with: Tape Dental Injury: Teeth and Oropharynx as per pre-operative assessment

## 2018-05-22 NOTE — Anesthesia Postprocedure Evaluation (Signed)
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  Procedure(s) Performed: LEFT ULNAR NEUROPLASTY AT THE ELBOW (Left Elbow)     Patient location during evaluation: PACU Anesthesia Type: General Level of consciousness: awake and alert Pain management: pain level controlled Vital Signs Assessment: post-procedure vital signs reviewed and stable Respiratory status: spontaneous breathing, nonlabored ventilation, respiratory function stable and patient connected to nasal cannula oxygen Cardiovascular status: blood pressure returned to baseline and stable Postop Assessment: no apparent nausea or vomiting Anesthetic complications: no    Last Vitals:  Vitals:   05/22/18 0900 05/22/18 0915  BP: 127/69 140/80  Pulse: 85 83  Resp: 16 15  Temp:    SpO2: 100% 100%    Last Pain:  Vitals:   05/22/18 0915  TempSrc:   PainSc: 6                  Yocelin Vanlue

## 2018-05-22 NOTE — Discharge Instructions (Signed)
Discharge Instructions   You have a light dressing on your hand.  You may begin gentle motion of your fingers and hand immediately, but you should not do any heavy lifting or gripping.  Elevate your hand to reduce pain & swelling of the digits.  Ice over the operative site may be helpful to reduce pain & swelling.  DO NOT USE HEAT. Pain medicine has been prescribed for you.  Take the 800 mg of ibuprofen and 650 mgTylenol together every 6-8 hours. Use the Oxycodone additionally for severe breakthrough pain. Leave the dressing in place and utilize the sling until the third day after your surgery and then remove it, leaving it open to air.  After the bandage has been removed you may shower, regularly washing the incision and letting the water run over it, but not submerging it (no swimming, soaking it in dishwater, etc.) You may drive a car when you are off of prescription pain medications and can safely control your vehicle with both hands. We will address whether therapy will be required or not when you return to the office. You may have already made your follow-up appointment when we completed your preop visit.  If not, please call our office today or the next business day to make your return appointment for 10-15 days after surgery.   Please call [phone] during normal business hours or [phone] after hours for any problems. Including the following:  - excessive redness of the incisions - drainage for more than 4 days - fever of more than 101.5 F  *Please note that pain medications will not be refilled after hours or on weekends.  Work Status: Patient may return to work on Tuesday 05/26/2018 with no lifting gripping or grasping greater than pencil and paper tasks.

## 2018-05-22 NOTE — Transfer of Care (Signed)
Immediate Anesthesia Transfer of Care Note  Patient: Shelby Johnson  Procedure(s) Performed: LEFT ULNAR NEUROPLASTY AT THE ELBOW (Left Elbow)  Patient Location: PACU  Anesthesia Type:General  Level of Consciousness: awake, alert , oriented and patient cooperative  Airway & Oxygen Therapy: Patient Spontanous Breathing and Patient connected to face mask  Post-op Assessment: Report given to RN and Post -op Vital signs reviewed and stable  Post vital signs: Reviewed and stable  Last Vitals:  Vitals Value Taken Time  BP    Temp    Pulse    Resp    SpO2      Last Pain:  Vitals:   05/22/18 0625  TempSrc:   PainSc: 0-No pain      Patients Stated Pain Goal: 4 (05/22/18 [id number])  Complications: No apparent anesthesia complications

## 2018-05-22 NOTE — Interval H&P Note (Signed)
History and Physical Interval Note:  05/22/2018 7:24 AM  Shelby Johnson  has presented today for surgery, with the diagnosis of LEFT CUBITAL TUNNEL SYNDROME G56.22  The various methods of treatment have been discussed with the patient and family. After consideration of risks, benefits and other options for treatment, the patient has consented to  Procedure(s): LEFT ULNAR NEUROPLASTY AT THE ELBOW (Left) as a surgical intervention .  The patient's history has been reviewed, patient examined, no change in status, stable for surgery.  I have reviewed the patient's chart and labs.  Questions were answered to the patient's satisfaction.     Jodi Marbleavid A Mariadejesus Cade

## 2018-05-23 DIAGNOSIS — R072 Precordial pain: Secondary | ICD-10-CM | POA: Diagnosis not present

## 2018-05-23 DIAGNOSIS — G8918 Other acute postprocedural pain: Secondary | ICD-10-CM | POA: Diagnosis not present

## 2018-05-23 DIAGNOSIS — R0789 Other chest pain: Secondary | ICD-10-CM | POA: Diagnosis not present

## 2018-05-23 DIAGNOSIS — G5620 Lesion of ulnar nerve, unspecified upper limb: Secondary | ICD-10-CM | POA: Diagnosis not present

## 2018-05-24 DIAGNOSIS — R9431 Abnormal electrocardiogram [ECG] [EKG]: Secondary | ICD-10-CM | POA: Diagnosis not present

## 2018-05-25 ENCOUNTER — Encounter (HOSPITAL_COMMUNITY): Payer: Self-pay | Admitting: Orthopedic Surgery

## 2018-06-01 DIAGNOSIS — G5622 Lesion of ulnar nerve, left upper limb: Secondary | ICD-10-CM | POA: Diagnosis not present

## 2018-06-08 DIAGNOSIS — L03113 Cellulitis of right upper limb: Secondary | ICD-10-CM | POA: Diagnosis not present

## 2018-10-14 DIAGNOSIS — J018 Other acute sinusitis: Secondary | ICD-10-CM | POA: Diagnosis not present

## 2018-10-17 DIAGNOSIS — J069 Acute upper respiratory infection, unspecified: Secondary | ICD-10-CM | POA: Diagnosis not present

## 2018-10-29 DIAGNOSIS — N3001 Acute cystitis with hematuria: Secondary | ICD-10-CM | POA: Diagnosis not present

## 2018-10-29 DIAGNOSIS — N3000 Acute cystitis without hematuria: Secondary | ICD-10-CM | POA: Diagnosis not present

## 2018-11-26 DIAGNOSIS — J00 Acute nasopharyngitis [common cold]: Secondary | ICD-10-CM | POA: Diagnosis not present

## 2019-02-05 DIAGNOSIS — E782 Mixed hyperlipidemia: Secondary | ICD-10-CM | POA: Diagnosis not present

## 2019-02-05 DIAGNOSIS — R7303 Prediabetes: Secondary | ICD-10-CM | POA: Diagnosis not present

## 2019-02-05 DIAGNOSIS — F418 Other specified anxiety disorders: Secondary | ICD-10-CM | POA: Diagnosis not present

## 2019-02-25 DIAGNOSIS — R82998 Other abnormal findings in urine: Secondary | ICD-10-CM | POA: Diagnosis not present

## 2019-02-25 DIAGNOSIS — R110 Nausea: Secondary | ICD-10-CM | POA: Diagnosis not present

## 2019-02-25 DIAGNOSIS — R1032 Left lower quadrant pain: Secondary | ICD-10-CM | POA: Diagnosis not present

## 2019-02-25 DIAGNOSIS — R109 Unspecified abdominal pain: Secondary | ICD-10-CM | POA: Diagnosis not present

## 2019-04-22 DIAGNOSIS — R51 Headache: Secondary | ICD-10-CM | POA: Diagnosis not present

## 2019-04-26 DIAGNOSIS — G43009 Migraine without aura, not intractable, without status migrainosus: Secondary | ICD-10-CM | POA: Diagnosis not present

## 2019-04-26 DIAGNOSIS — H9209 Otalgia, unspecified ear: Secondary | ICD-10-CM | POA: Diagnosis not present

## 2019-04-26 DIAGNOSIS — R42 Dizziness and giddiness: Secondary | ICD-10-CM | POA: Diagnosis not present

## 2019-04-26 DIAGNOSIS — R51 Headache: Secondary | ICD-10-CM | POA: Diagnosis not present

## 2019-04-26 DIAGNOSIS — Z6841 Body Mass Index (BMI) 40.0 and over, adult: Secondary | ICD-10-CM | POA: Diagnosis not present

## 2019-04-26 DIAGNOSIS — R110 Nausea: Secondary | ICD-10-CM | POA: Diagnosis not present

## 2019-04-26 DIAGNOSIS — G43909 Migraine, unspecified, not intractable, without status migrainosus: Secondary | ICD-10-CM | POA: Diagnosis not present

## 2019-05-31 DIAGNOSIS — F418 Other specified anxiety disorders: Secondary | ICD-10-CM | POA: Diagnosis not present

## 2019-05-31 DIAGNOSIS — Z23 Encounter for immunization: Secondary | ICD-10-CM | POA: Diagnosis not present

## 2019-05-31 DIAGNOSIS — R7303 Prediabetes: Secondary | ICD-10-CM | POA: Diagnosis not present

## 2019-05-31 DIAGNOSIS — E782 Mixed hyperlipidemia: Secondary | ICD-10-CM | POA: Diagnosis not present

## 2019-06-24 DIAGNOSIS — J060 Acute laryngopharyngitis: Secondary | ICD-10-CM | POA: Diagnosis not present

## 2019-06-24 DIAGNOSIS — Z20828 Contact with and (suspected) exposure to other viral communicable diseases: Secondary | ICD-10-CM | POA: Diagnosis not present

## 2019-08-09 DIAGNOSIS — M545 Low back pain: Secondary | ICD-10-CM | POA: Diagnosis not present

## 2019-08-09 DIAGNOSIS — M9903 Segmental and somatic dysfunction of lumbar region: Secondary | ICD-10-CM | POA: Diagnosis not present

## 2019-08-09 DIAGNOSIS — M542 Cervicalgia: Secondary | ICD-10-CM | POA: Diagnosis not present

## 2019-08-09 DIAGNOSIS — M9901 Segmental and somatic dysfunction of cervical region: Secondary | ICD-10-CM | POA: Diagnosis not present

## 2019-08-19 DIAGNOSIS — R600 Localized edema: Secondary | ICD-10-CM | POA: Diagnosis not present

## 2019-08-19 DIAGNOSIS — R0683 Snoring: Secondary | ICD-10-CM | POA: Diagnosis not present

## 2019-08-19 DIAGNOSIS — N3000 Acute cystitis without hematuria: Secondary | ICD-10-CM | POA: Diagnosis not present

## 2019-09-06 DIAGNOSIS — E119 Type 2 diabetes mellitus without complications: Secondary | ICD-10-CM | POA: Diagnosis not present

## 2019-09-06 DIAGNOSIS — E1169 Type 2 diabetes mellitus with other specified complication: Secondary | ICD-10-CM | POA: Diagnosis not present

## 2019-09-06 DIAGNOSIS — R5383 Other fatigue: Secondary | ICD-10-CM | POA: Diagnosis not present

## 2019-09-06 DIAGNOSIS — F418 Other specified anxiety disorders: Secondary | ICD-10-CM | POA: Diagnosis not present

## 2019-09-06 DIAGNOSIS — N393 Stress incontinence (female) (male): Secondary | ICD-10-CM | POA: Diagnosis not present

## 2019-09-06 DIAGNOSIS — E782 Mixed hyperlipidemia: Secondary | ICD-10-CM | POA: Diagnosis not present

## 2019-09-27 DIAGNOSIS — H01005 Unspecified blepharitis left lower eyelid: Secondary | ICD-10-CM | POA: Diagnosis not present

## 2019-10-07 ENCOUNTER — Encounter: Payer: Self-pay | Admitting: Neurology

## 2019-10-11 ENCOUNTER — Ambulatory Visit (INDEPENDENT_AMBULATORY_CARE_PROVIDER_SITE_OTHER): Payer: BC Managed Care – PPO | Admitting: Neurology

## 2019-10-11 ENCOUNTER — Encounter: Payer: Self-pay | Admitting: Neurology

## 2019-10-11 ENCOUNTER — Other Ambulatory Visit: Payer: Self-pay

## 2019-10-11 VITALS — BP 148/93 | HR 72 | Temp 97.4°F | Ht 67.0 in | Wt 380.0 lb

## 2019-10-11 DIAGNOSIS — F5104 Psychophysiologic insomnia: Secondary | ICD-10-CM

## 2019-10-11 DIAGNOSIS — G47411 Narcolepsy with cataplexy: Secondary | ICD-10-CM | POA: Diagnosis not present

## 2019-10-11 DIAGNOSIS — G4753 Recurrent isolated sleep paralysis: Secondary | ICD-10-CM | POA: Diagnosis not present

## 2019-10-11 DIAGNOSIS — R0683 Snoring: Secondary | ICD-10-CM | POA: Insufficient documentation

## 2019-10-11 DIAGNOSIS — R5382 Chronic fatigue, unspecified: Secondary | ICD-10-CM

## 2019-10-11 DIAGNOSIS — G4719 Other hypersomnia: Secondary | ICD-10-CM

## 2019-10-11 DIAGNOSIS — Z6841 Body Mass Index (BMI) 40.0 and over, adult: Secondary | ICD-10-CM

## 2019-10-11 NOTE — Progress Notes (Signed)
SLEEP MEDICINE CLINIC    Provider:  Larey Seat, MD  Primary Care Physician:  Marge Duncans, PA-C [address] [place] [address]     Referring Provider: Marge Duncans, [place]         Chief Complaint according to patient   Patient presents with:    . New Patient (Initial Visit)           HISTORY OF PRESENT ILLNESS:  Shelby Johnson is a 29 y.o. year old Caucasian female patient seen on 10/11/2019 from  [place]. Chief concern according to patient :  I cannot easily go to sleep but feel sleepy all day.   Ophthalmology the patient endorsed that she feels all day that she is wanting to go to sleep but then at night has trouble to actually fall asleep.  She is also waking up throughout the night feels that her sleep is night and not deep and not of restorative quality.  She has a dry mouth when waking up and her husband was confirmed that she snores but he did not feel that this was loud or intrusive.  She does have a history of migraines pronounced by a visual aura and those have gotten more frequent sometimes lasting more than a dozen more than a day.  She feels excessively fatigued.  She endorsed also aching muscles easily feeling cold, and increased level of first, some depression and anxiety racing thoughts, decreased energy, restless legs.   I have the pleasure of seeing Shelby Johnson, a left-handed Caucasian female with a medical history of Anxiety, Cubital tunnel syndrome, Depression, Diabetes mellitus without complication ([place]), Headache, migraines with visual aura- nausea and photophobia, status migrainosus.   Hypercholesterolemia, Insomnia, and Pre-diabetes..The patient is super-obese, she has ankle edema.    The patient had a HST by [employer] through PCP-  In 2020. Chief complaint as fatigue and EDS.  Sleep relevant medical history: Nocturia 1-2. Family medical /sleep history: no other family member on CPAP with OSA.    Social history:  Patient is working for  [employer] and lives in a household with 4 persons, with husband , mother in Sports coach and brother is Sports coach with wife.  The patient currently works from home.  Tobacco use- never .  ETOH use ; none , Caffeine intake in form of Coffee( none) Soda( 1 can a day) Tea ( 1-2 a day) or energy drinks. Regular exercise- none    Sleep habits are as follows: The patient's dinner time is around 7 PM. The patient goes to bed at 9.30 PM and feels tired, but cannot go to sleep- often it is 1.30 AM. Once she is asleep, she  continues to sleep for 3- 4 hours, wakes for bathroom breaks, the first time at 3 AM and another time at 5 AM.   The preferred sleep position is on the left side , with the support of 2 pillows.  Dreams are reportedly frequent.  6.20 AM is the usual rise time. The patient wakes up spontaneously between 5-  6.15 AM .  She reports not feeling refreshed or restored in AM, with symptoms such as dry mouth, morning headaches- dull and throbbing, and residual fatigue. She gets headaches throughout the day- visual auras and followed by migraines.  Naps are taken as frequently as she can, lasting from 1-2 hours and are as refreshing as nocturnal sleep.    Review of Systems: Out of a complete 14 system review, the patient  complains of only the following symptoms, and all other reviewed systems are negative.:  Fatigue, sleepiness in daytime , husband stated she is snoring, she has woken herself - fragmented sleep, Insomnia !!  She has experienced sleep paralysis, without dream intrusion, but she reports cataplexy.  Weak knees when angry. Myalgia, soreness.    How likely are you to doze in the following situations: 0 = not likely, 1 = slight chance, 2 = moderate chance, 3 = high chance   Sitting and Reading?2 Watching Television?2 Sitting inactive in a public place (theater or meeting)? 1 As a passenger in a car for an hour without a break?3 Lying down in the afternoon when circumstances permit? 3 Sitting  and talking to someone?0 Sitting quietly after lunch without alcohol?2 In a car, while stopped for a few minutes in traffic?0   Total = 12/ 24 points   FSS endorsed at 43/ 63 points.   Social History   Socioeconomic History  . Marital status: Married    Spouse name: Not on file  . Number of children: Not on file  . Years of education: Not on file  . Highest education level: Not on file  Occupational History  . Not on file  Tobacco Use  . Smoking status: Never Smoker  . Smokeless tobacco: Never Used  Substance and Sexual Activity  . Alcohol use: Not Currently  . Drug use: Never  . Sexual activity: Not on file  Other Topics Concern  . Not on file  Social History Narrative  . Not on file   Social Determinants of Health   Financial Resource Strain:   . Difficulty of Paying Living Expenses: Not on file  Food Insecurity:   . Worried About Charity fundraiser in the Last Year: Not on file  . Ran Out of Food in the Last Year: Not on file  Transportation Needs:   . Lack of Transportation (Medical): Not on file  . Lack of Transportation (Non-Medical): Not on file  Physical Activity:   . Days of Exercise per Week: Not on file  . Minutes of Exercise per Session: Not on file  Stress:   . Feeling of Stress : Not on file  Social Connections:   . Frequency of Communication with Friends and Family: Not on file  . Frequency of Social Gatherings with Friends and Family: Not on file  . Attends Religious Services: Not on file  . Active Member of Clubs or Organizations: Not on file  . Attends Archivist Meetings: Not on file  . Marital Status: Not on file    Family History  Problem Relation Age of Onset  . Lupus Mother   . Peripheral vascular disease Mother   . Hypercholesterolemia Mother   . CAD Mother   . Atrial fibrillation Father   . Cancer Father   . Hypercholesterolemia Father   . Hypertension Father   . Lung cancer Paternal Grandmother   . Diabetes Paternal  Grandmother   . Hypertension Paternal Grandmother   . Hypercholesterolemia Paternal Grandmother   . Brain cancer Paternal Grandfather     Past Medical History:  Diagnosis Date  . Anxiety   . Cubital tunnel syndrome   . Depression   . Diabetes mellitus without complication ([place])   . Headache    and migraines   . Hypercholesterolemia   . Insomnia    Hypersomnia    She has already had a so-called home sleep test which was an apnea link pulse  ox and this has really not established any high risk for sleep her AHI which is hard to measure in this 3 channel test was less than 1 and that is almost unbelievable.  Also given that she has worn the test apparatus for over 10 hours but only slept for for 5 I would be thinking that this is not indicative of a true result.  The test was just done on 12 September 2019 so very recently she did 2 nights the second night the AHI was 1.3 recording time was 9 hours this was from 09 September 2019 I would very much like to see her in the sleep lab.    Past Surgical History:  Procedure Laterality Date  . ANTERIOR INTEROSSEOUS NERVE DECOMPRESSION Left 05/22/2018   Procedure: LEFT ULNAR NEUROPLASTY AT THE ELBOW;  Surgeon: Milly Jakob, MD;  Location: [facility];  Service: Orthopedics;  Laterality: Left;  . CARPAL TUNNEL RELEASE Left 11/2015  . CARPAL TUNNEL RELEASE Right 2018     Current Outpatient Medications on File Prior to Visit  Medication Sig Dispense Refill  . atorvastatin (LIPITOR) 80 MG tablet Take 80 mg by mouth every morning.   1  . clonazePAM (KLONOPIN) 0.5 MG tablet Take 0.5 mg by mouth daily.    Marland Kitchen escitalopram (LEXAPRO) 20 MG tablet Take 20 mg by mouth every morning.  0  . furosemide (LASIX) 20 MG tablet Take 20 mg by mouth daily as needed for edema.    Marland Kitchen ibuprofen (ADVIL) 200 MG tablet Take 3 tablets (600 mg total) by mouth every 6 (six) hours. (Patient taking differently: Take 600 mg by mouth 3 (three) times daily as needed. )    . Liraglutide  -Weight Management (SAXENDA [place]) Inject 3 mg into the skin daily.     . medroxyPROGESTERone (DEPO-PROVERA) 150 MG/ML injection Inject into the muscle.    . oxybutynin (DITROPAN) 5 MG tablet Take 5 mg by mouth 2 (two) times daily.    . traZODone (DESYREL) 100 MG tablet Take by mouth at bedtime.      No current facility-administered medications on file prior to visit.    Allergies  Allergen Reactions  . Oxycodone     Chest pain  . Imitrex [Sumatriptan] Other (See Comments)    Tenses body up  . Adipex-P [Phentermine Hcl] Palpitations    Physical exam:  Today's Vitals   10/11/19 1057  BP: (!) 148/93  Pulse: 72  Temp: (!) 97.4 F (36.3 C)  Weight: (!) 380 lb (172.4 kg)  Height: _0  (1.702 m)   Body mass index is 59.52 kg/m.   Wt Readings from Last 3 Encounters:  10/11/19 (!) 380 lb (172.4 kg)  05/22/18 (!) 346 lb (156.9 kg)  05/18/18 (!) 347 lb (157.4 kg)     Ht Readings from Last 3 Encounters:  10/11/19 _1  (1.702 m)  05/22/18 _2  (1.676 m)  05/18/18 _3  (1.676 m)      General: The patient is awake, alert and appears not in acute distress. The patient is well groomed. Head: Normocephalic, atraumatic. Neck is supple. Mallampati : 2 ,  neck circumference:18 inches. Nasal airflow patent.  Retrognathia is not  seen. Piercing in nose and tongue Dental status: intact  Cardiovascular:  Regular rate and cardiac rhythm by pulse, without distended neck veins. Respiratory: Lungs are clear to auscultation.  Skin:  Without evidence of ankle edema, or rash. Trunk: The patient's posture is erect.   Neurologic exam : The patient is  awake and alert, oriented to place and time.   Memory subjective described as intact. Attention span & concentration ability appears normal. Speech is fluent,  without  dysarthria, dysphonia or aphasia.  Mood and affect are appropriate.   Cranial nerves: no loss of smell or taste reported.  Pupils are equal and briskly reactive to light.  Funduscopic exam deferred. .  Extraocular movements in vertical and horizontal planes were intact and without nystagmus. No Diplopia.Visual fields by finger perimetry are intact. Hearing was intact to finger rubbing.   Facial sensation intact to fine touch. Facial motor strength is symmetric and tongue and uvula move midline.  Neck ROM : rotation, tilt and flexion extension were normal for age and shoulder shrug was symmetrical.    Motor exam:  Symmetric bulk, tone and ROM.   Normal tone without cog wheeling, symmetric grip strength . Sensory:  Fine touch, pinprick and vibration were tested  and  normal.  Proprioception tested in the upper extremities was normal.  Coordination: Rapid alternating movements in the fingers/hands were of normal speed.  The Finger-to-nose maneuver was intact without evidence of ataxia, dysmetria - but mild action tremor bilaterally . Gait and station: Patient could rise unassisted from a seated position, walked without assistive device.  Toe and heel walk were deferred.  Deep tendon reflexes: in the  upper and lower extremities are symmetric and intact.  Babinski response was deferred.     After spending a total time of 45 minutes face to face and additional time for physical and neurologic examination, review of laboratory studies,  personal review of imaging studies, reports and results of other testing and review of referral information / records as far as provided in visit, I have established the following assessments:   1) Shelby Johnson reports that she has had trouble to initiate sleep and sleeps through the night.  She often wakes up an hour before she would have to leave the bed but does not feel refreshed or restored.  She also has taken long naps in the afternoon that sometimes left her craving just more sleep but not necessarily restoring her and other times she has felt some refreshment through napping.  Her husband has told her that she snores mildly he  has not witnessed apneas.  She feels fatigued, achy sore and has not muster the energy to exercise.  She has also gained a significant amount of weight over the last 18 months over 40 pounds and this at this point clearly diabetic.  Given her young age I would like to refer her to healthy weight and wellness and of course I will evaluate her sleep.  She has already had a so-called home sleep test which was an apnea link pulse ox and this has really not established any high risk for sleep her AHI which is hard to measure in this 3 channel test was less than 1 and that is almost unbelievable.  Also given that she has worn the test apparatus for over 10 hours but only slept for for 5 I would be thinking that this is not indicative of a true result.  The test was just done on 12 September 2019 so very recently she did 2 nights the second night the AHI was 1.3 recording time was 9 hours this was from 09 September 2019 I would very much like to see her in the sleep lab.   I want also to make sure that she understands that her localized edema and her  stress incontinence are related to obesity as well and can significantly improve.  She has experienced sleep paralysis, without dream intrusion, but she reports cataplexy.    Since she has trouble initiating sleep and not just sleeping through I suspect that there is a component of anxiety and depression playing a significant role here and she has been taking Lexapro at 20 mg.  She has also had access to clonazepam 0.5 mg as needed for anxiety which can serve as a sleep aid.  However it should not be routinely taken.  I would like for her to try melatonin at night 5 mg or less and to be taken before midnight it may help her to fall asleep quicker but usually it helps to stay asleep longer we will also go through some detail in terms of improving sleep hygiene.  I would like for her to turn the clocks around March to look at the time.  To keep the wound quiet down and cool.  She  has to decide if she wants a firm mattress but usually a softer mattress allows for sleeping on the side with less discomfort.  She may use a wedge under the mattress to lift the head of bed a little bit that can help with acid reflux as well as easier breathing.  I would like to restrict her time in bed to 8 hours meaning that sleep that has not taken place at night should not be caught up in daytime.  I hope that she therefore can concentrate her total sleep time into the nighttime.   I will give her the [facility] instructions for insomnia X and she may benefit from using Wellbutrin in the morning acid energy supplier.    My Plan is to proceed with:  1) morbid obesity - Amb. referral to Dr. Leafy Ro, weight and wellness- depression related comfort eating.   2) depression and insomnia - we dicussed sleep hygiene. I like to start wellbutrin after her sleep evaluation. Marland Kitchen   3) I will order an in lab sleep study to evaluate the patients frequent arousals from sleep. If negative for apnea will stay for MSLT. HLA narcolepsy test ordered. She is on 40 mg Lexapro and would need to wean off id we go for a narcolepsy MSLT - only if HLA is positive. .    I would like to thank Marge Duncans, PA-C and Darrol Jump, Pa-c No address on file for allowing me to meet with and to take care of this pleasant patient.   In short, Shelby Johnson is presenting with EDS, Insomnia and morbid obesity , all symptom that can be attributed to anxiety, depression and emotional eating. There may be OSA, hypoxemia and there may be narcolepsy.  We had a long discussion and counseling session. I hope to guide this pleasant patient through weight loss and hi hopefully insomnia improvement.   I plan to follow up either personally or through our NP within 3 month.   CC: I will share my notes with PCP.  Electronically signed by: Shelby Seat, MD 10/11/2019 11:09 AM  [facility] [employer] [facility] Board  certified by The [employer] of Sleep Medicine and Diplomate of the [employer] of Sleep Medicine. Board certified In Neurology through the [place], Fellow of the [employer] of Neurology. Medical Director of [employer].

## 2019-10-11 NOTE — Patient Instructions (Signed)

## 2019-10-18 LAB — NARCOLEPSY EVALUATION
DQA1*01:02: POSITIVE
DQB1*06:02: POSITIVE

## 2019-10-18 NOTE — Progress Notes (Signed)
Double positive narcolepsy alleles. We should work you up for MSLT narcolepsy. MSLT preparation often reqiures weaning of some medication. CD

## 2019-10-19 ENCOUNTER — Telehealth: Payer: Self-pay

## 2019-10-19 ENCOUNTER — Encounter: Payer: Self-pay | Admitting: Neurology

## 2019-10-19 ENCOUNTER — Other Ambulatory Visit: Payer: Self-pay | Admitting: Physician Assistant

## 2019-10-19 ENCOUNTER — Telehealth: Payer: Self-pay | Admitting: Neurology

## 2019-10-19 MED ORDER — FUROSEMIDE 20 MG PO TABS: 20.0000 mg | ORAL_TABLET | Freq: Every day | ORAL | 2 refills | Status: DC | PRN

## 2019-10-19 NOTE — Telephone Encounter (Signed)
Called the patient to inform her that the blood work did come back positive in both strands. Advised the patient that we would like to do in lab study but insurance will make [place] rule out apnea first and so we would need rule out apnea. Patient advised she did what she thought was a HSt in jan 2021 and was negative. Advised the patient that I would bring this to Dr Dohmeier attention and our sleep Personal assistant. Pt verbalized understanding.

## 2019-10-19 NOTE — Telephone Encounter (Signed)
-----   Message from Melvyn Novas, MD sent at 10/18/2019  4:53 PM EST ----- Double positive narcolepsy alleles. We should work you up for MSLT narcolepsy. MSLT preparation often reqiures weaning of some medication. CD

## 2019-10-19 NOTE — Telephone Encounter (Signed)
Called the patient back and advised that they are moving forward completed MSLt testing.advised that we would need to discuss weaning off medications. Reviewed the patient medication list. Advised that she would need to be off the meds at least 14 days prior to the sleep study. Informed the patient that in order to justify the validity of the MSLT, pt would have to complete a urine drug screen. Purpose of this is because is to assure that patient doesn't have anything that can cause a false reading of the study. Informed the patient I would address her medication list with Dr Vickey Huger and make sure that I told her right as far as which meds she would have to be off of. Advised the patient that she would have to be mindful of medications over the counter as well such as benadryl. Anything that can increase energy or sedating. Advised the patient that she would have to avoid substance use (it is not documented that patient does use, I just reviewed with her in case she doesn't disclose that with [place]) Pt verbalized understanding. Patient will follow up with ordering MD of her lexapro to get instructions on how to wean.

## 2019-10-19 NOTE — Telephone Encounter (Signed)
[employer] will not approve  PSG/MSLT with positive HLA and no co morbidities. We will need to rule out apnea first with HST.

## 2019-11-02 ENCOUNTER — Telehealth: Payer: Self-pay

## 2019-11-02 DIAGNOSIS — G4719 Other hypersomnia: Secondary | ICD-10-CM

## 2019-11-02 DIAGNOSIS — G4753 Recurrent isolated sleep paralysis: Secondary | ICD-10-CM

## 2019-11-02 DIAGNOSIS — Z6841 Body Mass Index (BMI) 40.0 and over, adult: Secondary | ICD-10-CM

## 2019-11-02 DIAGNOSIS — G47411 Narcolepsy with cataplexy: Secondary | ICD-10-CM

## 2019-11-02 NOTE — Telephone Encounter (Signed)
Can you place order for MSLT, Thanks!!

## 2019-11-02 NOTE — Addendum Note (Signed)
Addended by: Melvyn Novas on: 11/02/2019 05:31 PM   Modules accepted: Orders

## 2019-11-13 DIAGNOSIS — J019 Acute sinusitis, unspecified: Secondary | ICD-10-CM | POA: Diagnosis not present

## 2019-11-13 DIAGNOSIS — Z20822 Contact with and (suspected) exposure to covid-19: Secondary | ICD-10-CM | POA: Diagnosis not present

## 2019-11-18 ENCOUNTER — Other Ambulatory Visit: Payer: Self-pay | Admitting: Physician Assistant

## 2019-12-07 ENCOUNTER — Ambulatory Visit (INDEPENDENT_AMBULATORY_CARE_PROVIDER_SITE_OTHER): Payer: BC Managed Care – PPO | Admitting: Physician Assistant

## 2019-12-07 ENCOUNTER — Other Ambulatory Visit: Payer: Self-pay

## 2019-12-07 ENCOUNTER — Encounter: Payer: Self-pay | Admitting: Physician Assistant

## 2019-12-07 VITALS — BP 130/84 | HR 78 | Temp 97.8°F | Ht 67.0 in | Wt 371.8 lb

## 2019-12-07 DIAGNOSIS — F418 Other specified anxiety disorders: Secondary | ICD-10-CM | POA: Insufficient documentation

## 2019-12-07 DIAGNOSIS — E782 Mixed hyperlipidemia: Secondary | ICD-10-CM | POA: Insufficient documentation

## 2019-12-07 DIAGNOSIS — E119 Type 2 diabetes mellitus without complications: Secondary | ICD-10-CM | POA: Diagnosis not present

## 2019-12-07 DIAGNOSIS — Z6841 Body Mass Index (BMI) 40.0 and over, adult: Secondary | ICD-10-CM

## 2019-12-07 NOTE — Progress Notes (Signed)
Established Patient Office Visit  Subjective:  Patient ID: Shelby Johnson, female    DOB: 13-Feb-1991  Age: 29 y.o. MRN: [id number]  CC:  Chief Complaint  Patient presents with  . Diabetes  . Hyperlipidemia  . Depression    HPI Shelby Johnson presents for follow up diabetes     Shelby Johnson presents with type 2 diabetes mellitus without complications.  Specifically, this is type 2, non-insulin requiring diabetes without complications.  Compliance with treatment has been good; she takes her medication as directed, is trying to watch her diet, and follows up as directed.  Patient's diabetes was first diagnosed 1 year ago  Depression screen is performed and is negative.   Tobacco screen: Non-smoker.   She is currently on saxenda and metformin - states fasting glucose ranging in 150s    Pt presents with hyperlipidemia.  Date of diagnosis 11/16/2015.  Current treatment includes Lipitor and a low cholesterol/low fat diet.  Compliance with treatment has been good; she maintains her low cholesterol diet, follows up as directed,   She denies experiencing any hypercholesterolemia related symptoms.  Concurrent health problems include diabetes.      Additionally, she presents with history of depression with Anxiety.  this is a routine follow-up.  Date of diagnosis 07/2015.  Presently, she feels no degree of depression.  Current medications include Lexapro and conazepam.  Currently, she is doing well without any significant affective symptoms.  The symptoms are fairly infrequent.  Presently, Willistine denies suicidal ideation.  There are no current identifiable stressors. She is married and is living with her spouse.   Regarding employment, she works full-time.  She has no known pertinent medical conditions.  Past medical history is negative for prior depression or other psychiatric disorders.      Concerning stress incontinence (female) , this has been a problem for the past few months.  At last visit we  started oxybutynin which she states is working well      Past Medical History:  Diagnosis Date  . Anxiety   . Cubital tunnel syndrome   . Depression   . Depression with anxiety   . Diabetes mellitus without complication ([place])   . Headache    and migraines   . Hypercholesterolemia   . Insomnia   . Mixed hyperlipidemia   . Pre-diabetes   . Type 2 diabetes mellitus without complications [facility])     Past Surgical History:  Procedure Laterality Date  . ANTERIOR INTEROSSEOUS NERVE DECOMPRESSION Left 05/22/2018   Procedure: LEFT ULNAR NEUROPLASTY AT THE ELBOW;  Surgeon: Milly Jakob, MD;  Location: [facility];  Service: Orthopedics;  Laterality: Left;  . CARPAL TUNNEL RELEASE Left 11/2015  . CARPAL TUNNEL RELEASE Right 2018    Family History  Problem Relation Age of Onset  . Lupus Mother   . Peripheral vascular disease Mother   . Hypercholesterolemia Mother   . CAD Mother   . Atrial fibrillation Father   . Cancer Father   . Hypercholesterolemia Father   . Hypertension Father   . Lung cancer Paternal Grandmother   . Diabetes Paternal Grandmother   . Hypertension Paternal Grandmother   . Hypercholesterolemia Paternal Grandmother   . Brain cancer Paternal Grandfather     Social History   Socioeconomic History  . Marital status: Married    Spouse name: Not on file  . Number of children: Not on file  . Years of education: Not on file  . Highest education level: Not on file  Occupational  History  . Not on file  Tobacco Use  . Smoking status: Never Smoker  . Smokeless tobacco: Never Used  Substance and Sexual Activity  . Alcohol use: Not Currently  . Drug use: Never  . Sexual activity: Not on file  Other Topics Concern  . Not on file  Social History Narrative  . Not on file   Social Determinants of Health   Financial Resource Strain:   . Difficulty of Paying Living Expenses:   Food Insecurity:   . Worried About Charity fundraiser in the Last Year:   . Youth worker in the Last Year:   Transportation Needs:   . Film/video editor (Medical):   Marland Kitchen Lack of Transportation (Non-Medical):   Physical Activity:   . Days of Exercise per Week:   . Minutes of Exercise per Session:   Stress:   . Feeling of Stress :   Social Connections:   . Frequency of Communication with Friends and Family:   . Frequency of Social Gatherings with Friends and Family:   . Attends Religious Services:   . Active Member of Clubs or Organizations:   . Attends Archivist Meetings:   Marland Kitchen Marital Status:   Intimate Partner Violence:   . Fear of Current or Ex-Partner:   . Emotionally Abused:   Marland Kitchen Physically Abused:   . Sexually Abused:      Current Outpatient Medications:  .  atorvastatin (LIPITOR) 80 MG tablet, Take 80 mg by mouth every morning. , Disp: , Rfl: 1 .  clonazePAM (KLONOPIN) 0.5 MG tablet, Take 0.5 mg by mouth daily., Disp: , Rfl:  .  Continuous Blood Gluc Sensor (FREESTYLE LIBRE 14 DAY SENSOR) MISC, SMARTSIG:1 Each Topical Every 2 Weeks, Disp: , Rfl:  .  escitalopram (LEXAPRO) 20 MG tablet, Take 20 mg by mouth every morning., Disp: , Rfl: 0 .  furosemide (LASIX) 20 MG tablet, Take 1 tablet (20 mg total) by mouth daily as needed for edema., Disp: 30 tablet, Rfl: 2 .  ibuprofen (ADVIL) 200 MG tablet, Take 3 tablets (600 mg total) by mouth every 6 (six) hours. (Patient taking differently: Take 800 mg by mouth 3 (three) times daily. ), Disp: , Rfl:  .  Liraglutide -Weight Management (SAXENDA [place]), Inject 3 mg into the skin daily. , Disp: , Rfl:  .  medroxyPROGESTERone (DEPO-PROVERA) 150 MG/ML injection, Inject into the muscle., Disp: , Rfl:  .  metFORMIN (GLUCOPHAGE) 500 MG tablet, Take 500 mg by mouth 2 (two) times daily with a meal., Disp: , Rfl:  .  NOVOFINE 32G X 6 MM MISC, See admin instructions., Disp: , Rfl:  .  oxybutynin (DITROPAN) 5 MG tablet, Take 5 mg by mouth 2 (two) times daily., Disp: , Rfl:  .  traZODone (DESYREL) 100 MG tablet, Take by  mouth at bedtime. , Disp: , Rfl:    Allergies  Allergen Reactions  . Oxycodone     Chest pain  . Imitrex [Sumatriptan] Other (See Comments)    Tenses body up  . Adipex-P [Phentermine Hcl] Palpitations    ROS CONSTITUTIONAL: Negative for chills, fatigue, fever, unintentional weight gain and unintentional weight loss.  E/N/T: Negative for ear pain, nasal congestion and sore throat.  CARDIOVASCULAR: Negative for chest pain, dizziness, palpitations and pedal edema.  RESPIRATORY: Negative for recent cough and dyspnea.  GASTROINTESTINAL: Negative for abdominal pain, acid reflux symptoms, constipation, diarrhea, nausea and vomiting.  MSK: Negative for arthralgias and myalgias.  INTEGUMENTARY: Negative  for rash.  NEUROLOGICAL: Negative for dizziness and headaches.  PSYCHIATRIC: Negative for sleep disturbance and to question depression screen.  Negative for depression, negative for anhedonia.        Objective:    PHYSICAL EXAM:   VS: BP 130/84   Pulse 78   Temp 97.8 F (36.6 C)   Ht '5\' 7"'  (1.702 m)   Wt (!) 371 lb 12.8 oz (168.6 kg)   SpO2 100%   BMI 58.23 kg/m   GEN: Well nourished, well developed, in no acute distress  HEENT: normal external ears and nose - normal external auditory canals and TMS - hearing grossly normal - normal nasal mucosa and septum - Lips, Teeth and Gums - normal  Oropharynx - normal mucosa, palate, and posterior pharynx  Cardiac: RRR; no murmurs, rubs, or gallops,no edema - no significant varicosities Respiratory:  normal respiratory rate and pattern with no distress - normal breath sounds with no rales, rhonchi, wheezes or rubs  Diabetic Foot Exam - Simple   Simple Foot Form Visual Inspection No deformities, no ulcerations, no other skin breakdown bilaterally: Yes Sensation Testing Intact to touch and monofilament testing bilaterally: Yes Pulse Check Posterior Tibialis and Dorsalis pulse intact bilaterally: Yes Comments     Skin: warm and  dry, no rash  Neuro:  Alert and Oriented x 3, Strength and sensation are intact - CN II-Xii grossly intact Psych: euthymic mood, appropriate affect and demeanor  BP 130/84   Pulse 78   Temp 97.8 F (36.6 C)   Ht '5\' 7"'  (1.702 m)   Wt (!) 371 lb 12.8 oz (168.6 kg)   SpO2 100%   BMI 58.23 kg/m  Wt Readings from Last 3 Encounters:  12/07/19 (!) 371 lb 12.8 oz (168.6 kg)  10/11/19 (!) 380 lb (172.4 kg)  05/22/18 (!) 346 lb (156.9 kg)     Health Maintenance Due  Topic Date Due  . HEMOGLOBIN A1C  Never done  . PNEUMOCOCCAL POLYSACCHARIDE VACCINE AGE 28-64 HIGH RISK  Never done  . FOOT EXAM  Never done  . OPHTHALMOLOGY EXAM  Never done  . HIV Screening  Never done  . TETANUS/TDAP  Never done  . PAP-Cervical Cytology Screening  Never done  . PAP SMEAR-Modifier  Never done    There are no preventive care reminders to display for this patient.  No results found for: TSH Lab Results  Component Value Date   WBC 8.8 05/18/2018   HGB 11.9 (L) 05/18/2018   HCT 36.6 05/18/2018   MCV 86.5 05/18/2018   PLT 279 05/18/2018   No results found for: NA, K, CHLORIDE, CO2, GLUCOSE, BUN, CREATININE, BILITOT, ALKPHOS, AST, ALT, PROT, ALBUMIN, CALCIUM, ANIONGAP, EGFR, GFR No results found for: CHOL No results found for: HDL No results found for: LDLCALC No results found for: TRIG No results found for: CHOLHDL No results found for: HGBA1C    Assessment & Plan:   Problem List Items Addressed This Visit      Endocrine   Type 2 diabetes mellitus without complications ([place]) - Primary    Well controlled.  No changes to medicines.  Continue to work on eating a healthy diet and exercise.  Labs drawn today.        Relevant Medications   metFORMIN (GLUCOPHAGE) 500 MG tablet   Other Relevant Orders   CBC with Differential/Platelet   Comprehensive metabolic panel   TSH   Lipid panel   Hemoglobin A1c     Other   Class  3 severe obesity due to excess calories with serious comorbidity  and body mass index (BMI) of 60.0 to 69.9 in adult [facility])    Pt states she will be going to the bariatric center for further evaluation and management of weight      Relevant Medications   metFORMIN (GLUCOPHAGE) 500 MG tablet   Mixed hyperlipidemia    Well controlled.  No changes to medicines.  Continue to work on eating a healthy diet and exercise.  Labs drawn today.        Relevant Orders   Comprehensive metabolic panel   Lipid panel   Depression with anxiety    Continue current meds as directed         No orders of the defined types were placed in this encounter.   Follow-up: Return in about 3 months (around 03/07/2020) for chronic fasting follow up.    SARA R Sharon Stapel, PA-C

## 2019-12-07 NOTE — Assessment & Plan Note (Signed)
Well controlled.  ?No changes to medicines.  ?Continue to work on eating a healthy diet and exercise.  ?Labs drawn today.  ?

## 2019-12-07 NOTE — Assessment & Plan Note (Signed)
Continue current meds as directed 

## 2019-12-07 NOTE — Assessment & Plan Note (Signed)
Pt states she will be going to the bariatric center for further evaluation and management of weight

## 2019-12-08 LAB — LIPID PANEL
Chol/HDL Ratio: 4.5 ratio — ABNORMAL HIGH (ref 0.0–4.4)
Cholesterol, Total: 130 mg/dL (ref 100–199)
HDL: 29 mg/dL — ABNORMAL LOW (ref >39)
LDL Chol Calc (NIH): 85 mg/dL (ref 0–99)
Triglycerides: 80 mg/dL (ref 0–149)
VLDL Cholesterol Cal: 16 mg/dL (ref 5–40)

## 2019-12-08 LAB — COMPREHENSIVE METABOLIC PANEL
ALT: 22 IU/L (ref 0–32)
AST: 16 IU/L (ref 0–40)
Albumin/Globulin Ratio: 1.5 (ref 1.2–2.2)
Albumin: 4.1 g/dL (ref 3.9–5.0)
Alkaline Phosphatase: 91 IU/L (ref 39–117)
BUN/Creatinine Ratio: 13 (ref 9–23)
BUN: 11 mg/dL (ref 6–20)
Bilirubin Total: 0.4 mg/dL (ref 0.0–1.2)
CO2: 20 mmol/L (ref 20–29)
Calcium: 8.9 mg/dL (ref 8.7–10.2)
Chloride: 104 mmol/L (ref 96–106)
Creatinine, Ser: 0.82 mg/dL (ref 0.57–1.00)
GFR calc Af Amer: 114 mL/min/{1.73_m2} (ref >59)
GFR calc non Af Amer: 99 mL/min/{1.73_m2} (ref >59)
Globulin, Total: 2.7 g/dL (ref 1.5–4.5)
Glucose: 104 mg/dL — ABNORMAL HIGH (ref 65–99)
Potassium: 4.3 mmol/L (ref 3.5–5.2)
Sodium: 140 mmol/L (ref 134–144)
Total Protein: 6.8 g/dL (ref 6.0–8.5)

## 2019-12-08 LAB — CBC WITH DIFFERENTIAL/PLATELET
Basophils Absolute: 0 10*3/uL (ref 0.0–0.2)
Basos: 0 %
EOS (ABSOLUTE): 0.2 10*3/uL (ref 0.0–0.4)
Eos: 2 %
Hematocrit: 36.2 % (ref 34.0–46.6)
Hemoglobin: 11.8 g/dL (ref 11.1–15.9)
Immature Grans (Abs): 0.1 10*3/uL (ref 0.0–0.1)
Immature Granulocytes: 1 %
Lymphocytes Absolute: 2.3 10*3/uL (ref 0.7–3.1)
Lymphs: 28 %
MCH: 26.9 pg (ref 26.6–33.0)
MCHC: 32.6 g/dL (ref 31.5–35.7)
MCV: 83 fL (ref 79–97)
Monocytes Absolute: 0.6 10*3/uL (ref 0.1–0.9)
Monocytes: 8 %
Neutrophils Absolute: 5 10*3/uL (ref 1.4–7.0)
Neutrophils: 61 %
Platelets: 287 10*3/uL (ref 150–450)
RBC: 4.39 x10E6/uL (ref 3.77–5.28)
RDW: 14.6 % (ref 11.7–15.4)
WBC: 8.2 10*3/uL (ref 3.4–10.8)

## 2019-12-08 LAB — HEMOGLOBIN A1C
Est. average glucose Bld gHb Est-mCnc: 134 mg/dL
Hgb A1c MFr Bld: 6.3 % — ABNORMAL HIGH (ref 4.8–5.6)

## 2019-12-08 LAB — CARDIOVASCULAR RISK ASSESSMENT

## 2019-12-08 LAB — TSH: TSH: 1.06 u[IU]/mL (ref 0.450–4.500)

## 2019-12-14 ENCOUNTER — Other Ambulatory Visit: Payer: Self-pay | Admitting: Family Medicine

## 2019-12-14 MED ORDER — CLONAZEPAM 0.5 MG PO TABS: 0.5000 mg | ORAL_TABLET | Freq: Every day | ORAL | 1 refills | Status: DC

## 2019-12-18 ENCOUNTER — Other Ambulatory Visit: Payer: Self-pay | Admitting: Physician Assistant

## 2019-12-21 ENCOUNTER — Other Ambulatory Visit: Payer: Self-pay | Admitting: Physician Assistant

## 2020-01-05 ENCOUNTER — Encounter (INDEPENDENT_AMBULATORY_CARE_PROVIDER_SITE_OTHER): Payer: Self-pay

## 2020-01-17 ENCOUNTER — Ambulatory Visit (INDEPENDENT_AMBULATORY_CARE_PROVIDER_SITE_OTHER): Payer: BC Managed Care – PPO | Admitting: Family Medicine

## 2020-01-19 ENCOUNTER — Other Ambulatory Visit: Payer: Self-pay

## 2020-01-19 MED ORDER — CLONAZEPAM 0.5 MG PO TABS: 0.5000 mg | ORAL_TABLET | Freq: Every day | ORAL | 0 refills | Status: AC

## 2020-01-19 MED ORDER — ESCITALOPRAM OXALATE 20 MG PO TABS: 20.0000 mg | ORAL_TABLET | ORAL | 0 refills | Status: DC

## 2020-01-19 NOTE — Telephone Encounter (Signed)
Husband called  Wife had to go to [facility] on an emergency   Needs lexapro and klonopin sent to [facility] in [place]

## 2020-01-20 ENCOUNTER — Other Ambulatory Visit: Payer: Self-pay | Admitting: Physician Assistant

## 2020-01-20 MED ORDER — METFORMIN HCL 500 MG PO TABS: 500.0000 mg | ORAL_TABLET | Freq: Two times a day (BID) | ORAL | 2 refills | Status: AC

## 2020-01-26 ENCOUNTER — Other Ambulatory Visit: Payer: Self-pay | Admitting: Family Medicine

## 2020-02-01 ENCOUNTER — Ambulatory Visit (INDEPENDENT_AMBULATORY_CARE_PROVIDER_SITE_OTHER): Payer: BC Managed Care – PPO | Admitting: Family Medicine

## 2020-02-04 ENCOUNTER — Other Ambulatory Visit: Payer: Self-pay

## 2020-02-04 MED ORDER — BUSPIRONE HCL 5 MG PO TABS: 5.0000 mg | ORAL_TABLET | Freq: Three times a day (TID) | ORAL | 0 refills | Status: AC

## 2020-02-24 ENCOUNTER — Other Ambulatory Visit: Payer: Self-pay | Admitting: Physician Assistant

## 2020-03-12 ENCOUNTER — Other Ambulatory Visit: Payer: Self-pay | Admitting: Family Medicine

## 2020-03-14 ENCOUNTER — Ambulatory Visit: Payer: BC Managed Care – PPO | Admitting: Physician Assistant

## 2020-03-23 ENCOUNTER — Other Ambulatory Visit: Payer: Self-pay

## 2020-04-10 ENCOUNTER — Other Ambulatory Visit: Payer: Self-pay | Admitting: Family Medicine

## 2020-06-08 ENCOUNTER — Other Ambulatory Visit: Payer: Self-pay | Admitting: Physician Assistant

## 2020-06-09 ENCOUNTER — Other Ambulatory Visit: Payer: Self-pay | Admitting: Family Medicine

## 2022-04-10 ENCOUNTER — Encounter (INDEPENDENT_AMBULATORY_CARE_PROVIDER_SITE_OTHER): Payer: Self-pay
